# Patient Record
Sex: Female | Born: 1950 | Race: White | Hispanic: No | Marital: Single | Smoking: Never smoker
Health system: Southern US, Community
[De-identification: ages and names within clinical notes are randomized; demographics above are authoritative.]

## PROBLEM LIST (undated history)

## (undated) DIAGNOSIS — R7303 Prediabetes: Secondary | ICD-10-CM

## (undated) DIAGNOSIS — E785 Hyperlipidemia, unspecified: Secondary | ICD-10-CM

## (undated) DIAGNOSIS — K635 Polyp of colon: Secondary | ICD-10-CM

## (undated) DIAGNOSIS — D649 Anemia, unspecified: Secondary | ICD-10-CM

## (undated) DIAGNOSIS — J45909 Unspecified asthma, uncomplicated: Secondary | ICD-10-CM

## (undated) DIAGNOSIS — I219 Acute myocardial infarction, unspecified: Secondary | ICD-10-CM

## (undated) HISTORY — PX: LYMPH NODE BIOPSY: SHX201

## (undated) HISTORY — DX: Unspecified asthma, uncomplicated: J45.909

## (undated) HISTORY — DX: Prediabetes: R73.03

## (undated) HISTORY — DX: Polyp of colon: K63.5

## (undated) HISTORY — DX: Anemia, unspecified: D64.9

## (undated) HISTORY — DX: Hyperlipidemia, unspecified: E78.5

---

## 2013-02-17 DIAGNOSIS — I252 Old myocardial infarction: Secondary | ICD-10-CM | POA: Insufficient documentation

## 2013-05-17 LAB — HM COLONOSCOPY

## 2015-05-27 LAB — HM MAMMOGRAPHY

## 2015-06-19 DIAGNOSIS — E559 Vitamin D deficiency, unspecified: Secondary | ICD-10-CM | POA: Insufficient documentation

## 2015-06-30 DIAGNOSIS — E079 Disorder of thyroid, unspecified: Secondary | ICD-10-CM | POA: Insufficient documentation

## 2015-06-30 DIAGNOSIS — R221 Localized swelling, mass and lump, neck: Secondary | ICD-10-CM | POA: Insufficient documentation

## 2015-07-22 DIAGNOSIS — H269 Unspecified cataract: Secondary | ICD-10-CM | POA: Insufficient documentation

## 2015-07-22 DIAGNOSIS — H43813 Vitreous degeneration, bilateral: Secondary | ICD-10-CM | POA: Insufficient documentation

## 2017-02-28 ENCOUNTER — Encounter (INDEPENDENT_AMBULATORY_CARE_PROVIDER_SITE_OTHER): Payer: Self-pay

## 2017-02-28 ENCOUNTER — Ambulatory Visit (INDEPENDENT_AMBULATORY_CARE_PROVIDER_SITE_OTHER): Payer: BLUE CROSS/BLUE SHIELD | Admitting: Physician Assistant

## 2017-02-28 ENCOUNTER — Encounter: Payer: Self-pay | Admitting: Physician Assistant

## 2017-02-28 VITALS — BP 133/71 | HR 60 | Ht 63.0 in | Wt 166.0 lb

## 2017-02-28 DIAGNOSIS — I252 Old myocardial infarction: Secondary | ICD-10-CM | POA: Diagnosis not present

## 2017-02-28 DIAGNOSIS — Z1211 Encounter for screening for malignant neoplasm of colon: Secondary | ICD-10-CM

## 2017-02-28 DIAGNOSIS — Z130 Encounter for screening for diseases of the blood and blood-forming organs and certain disorders involving the immune mechanism: Secondary | ICD-10-CM | POA: Diagnosis not present

## 2017-02-28 DIAGNOSIS — J4520 Mild intermittent asthma, uncomplicated: Secondary | ICD-10-CM

## 2017-02-28 DIAGNOSIS — Z1231 Encounter for screening mammogram for malignant neoplasm of breast: Secondary | ICD-10-CM

## 2017-02-28 DIAGNOSIS — L84 Corns and callosities: Secondary | ICD-10-CM | POA: Diagnosis not present

## 2017-02-28 DIAGNOSIS — Z1329 Encounter for screening for other suspected endocrine disorder: Secondary | ICD-10-CM | POA: Diagnosis not present

## 2017-02-28 DIAGNOSIS — Z862 Personal history of diseases of the blood and blood-forming organs and certain disorders involving the immune mechanism: Secondary | ICD-10-CM

## 2017-02-28 DIAGNOSIS — Z13820 Encounter for screening for osteoporosis: Secondary | ICD-10-CM

## 2017-02-28 DIAGNOSIS — Z1159 Encounter for screening for other viral diseases: Secondary | ICD-10-CM | POA: Diagnosis not present

## 2017-02-28 DIAGNOSIS — I2510 Atherosclerotic heart disease of native coronary artery without angina pectoris: Secondary | ICD-10-CM

## 2017-02-28 DIAGNOSIS — Z7689 Persons encountering health services in other specified circumstances: Secondary | ICD-10-CM | POA: Diagnosis not present

## 2017-02-28 DIAGNOSIS — Z800 Family history of malignant neoplasm of digestive organs: Secondary | ICD-10-CM

## 2017-02-28 DIAGNOSIS — E559 Vitamin D deficiency, unspecified: Secondary | ICD-10-CM

## 2017-02-28 DIAGNOSIS — R7303 Prediabetes: Secondary | ICD-10-CM | POA: Diagnosis not present

## 2017-02-28 DIAGNOSIS — E663 Overweight: Secondary | ICD-10-CM | POA: Insufficient documentation

## 2017-02-28 DIAGNOSIS — Z780 Asymptomatic menopausal state: Secondary | ICD-10-CM

## 2017-02-28 MED ORDER — ALBUTEROL SULFATE HFA 108 (90 BASE) MCG/ACT IN AERS: 1.0000 | INHALATION_SPRAY | RESPIRATORY_TRACT | 3 refills | Status: DC | PRN

## 2017-02-28 MED ORDER — CALCIUM CARBONATE-VITAMIN D 600-400 MG-UNIT PO TABS: 1.0000 | ORAL_TABLET | Freq: Two times a day (BID) | ORAL | 11 refills | Status: DC

## 2017-02-28 MED ORDER — ATORVASTATIN CALCIUM 40 MG PO TABS: 40.0000 mg | ORAL_TABLET | Freq: Every day | ORAL | 1 refills | Status: DC

## 2017-02-28 NOTE — Addendum Note (Signed)
Addended by: Nelson Chimes E on: 02/28/2017 03:02 PM   Modules accepted: Orders

## 2017-02-28 NOTE — Patient Instructions (Signed)
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
## 2017-03-06 ENCOUNTER — Encounter: Payer: Self-pay | Admitting: Physician Assistant

## 2017-03-08 ENCOUNTER — Ambulatory Visit (INDEPENDENT_AMBULATORY_CARE_PROVIDER_SITE_OTHER): Payer: BLUE CROSS/BLUE SHIELD | Admitting: Physician Assistant

## 2017-03-08 VITALS — BP 134/62 | HR 68 | Temp 98.5°F

## 2017-03-08 DIAGNOSIS — Z23 Encounter for immunization: Secondary | ICD-10-CM | POA: Diagnosis not present

## 2017-03-08 NOTE — Progress Notes (Signed)
Patient came in office today for Prevnar 13 injection. Patient tolerated injection in Left Deltoid well. No immediate complications. Printed vaccine info sheet provided to patient. Patient advised to contact clinic with any concerns.  Vitals:   03/08/17 0948  BP: 134/62  Pulse: 68  Temp: 98.5 F (36.9 C)

## 2017-03-20 ENCOUNTER — Encounter: Payer: Self-pay | Admitting: Podiatry

## 2017-03-20 ENCOUNTER — Ambulatory Visit (INDEPENDENT_AMBULATORY_CARE_PROVIDER_SITE_OTHER): Payer: BLUE CROSS/BLUE SHIELD | Admitting: Podiatry

## 2017-03-20 VITALS — BP 124/73 | HR 81 | Ht 63.0 in | Wt 166.0 lb

## 2017-03-20 DIAGNOSIS — M79671 Pain in right foot: Secondary | ICD-10-CM | POA: Diagnosis not present

## 2017-03-20 DIAGNOSIS — M79672 Pain in left foot: Secondary | ICD-10-CM

## 2017-03-20 DIAGNOSIS — L851 Acquired keratosis [keratoderma] palmaris et plantaris: Secondary | ICD-10-CM | POA: Diagnosis not present

## 2017-03-20 NOTE — Patient Instructions (Signed)
Seen for painful callus on both feet. All lesions debrided and dressed. Return as needed.

## 2017-03-20 NOTE — Progress Notes (Signed)
Heart attack 2013. SUBJECTIVE: 67 y.o. year old female presents complaining of pain in both feet from corns and calluses duration of over 6 months.  Patient is referred by Dr. Maisie Fus.  Patient has moved from [place] last year.  Review of Systems  HENT: Negative.   Respiratory: Negative for shortness of breath and wheezing.        Asthma.  Cardiovascular: Negative for palpitations, claudication and leg swelling.       History of Heart attack in 2013.  Gastrointestinal: Negative.   Genitourinary: Negative.   Musculoskeletal: Negative.   Skin: Negative.      OBJECTIVE: DERMATOLOGIC EXAMINATION: Circular punctate porokeratosis plantar under 5th MPJ area bilateral. No acute skin lesions, no open lesions, no inflamed skin lesions noted.  VASCULAR EXAMINATION OF LOWER LIMBS: Dorsalis Pedis artery: Not palpable on both feet. Posterior Tibial artery:  Palpable with normal pulsation bilateral. Capillary Filling times within 3 seconds in all digits.  No edema or erythema noted. Temperature gradient from tibial crest to dorsum of foot is within normal bilateral.  NEUROLOGIC EXAMINATION OF THE LOWER LIMBS: All epicritic and tactile sensations grossly intact. Sharp and Dull discriminatory sensations at the plantar ball of hallux is intact bilateral.   MUSCULOSKELETAL EXAMINATION: Positive for Prominent 5th MPJ bilateral. Forefoot varus bilateral.   ASSESSMENT: Symptomatic porokeratosis sub 5 bilateral. Forefoot varus bilateral.  PLAN: Reviewed findings and available treatment options, mostly palliative care. All lesions debrided and painful area dressed with Amerigel ointment. Return as needed.

## 2017-03-22 ENCOUNTER — Other Ambulatory Visit: Payer: BLUE CROSS/BLUE SHIELD

## 2017-03-22 ENCOUNTER — Ambulatory Visit: Payer: BLUE CROSS/BLUE SHIELD

## 2017-03-29 ENCOUNTER — Ambulatory Visit (INDEPENDENT_AMBULATORY_CARE_PROVIDER_SITE_OTHER): Payer: BLUE CROSS/BLUE SHIELD

## 2017-03-29 DIAGNOSIS — Z1231 Encounter for screening mammogram for malignant neoplasm of breast: Secondary | ICD-10-CM | POA: Diagnosis not present

## 2017-03-29 DIAGNOSIS — Z13820 Encounter for screening for osteoporosis: Secondary | ICD-10-CM

## 2017-03-29 DIAGNOSIS — E559 Vitamin D deficiency, unspecified: Secondary | ICD-10-CM

## 2017-03-29 DIAGNOSIS — M8588 Other specified disorders of bone density and structure, other site: Secondary | ICD-10-CM | POA: Diagnosis not present

## 2017-03-29 DIAGNOSIS — M85851 Other specified disorders of bone density and structure, right thigh: Secondary | ICD-10-CM

## 2017-03-30 ENCOUNTER — Encounter: Payer: Self-pay | Admitting: Physician Assistant

## 2017-03-30 DIAGNOSIS — M8580 Other specified disorders of bone density and structure, unspecified site: Secondary | ICD-10-CM | POA: Insufficient documentation

## 2017-03-30 NOTE — Progress Notes (Signed)
Bone density shows osteopenia of the hip Recommend twice daily calcium and vitamin D supplement and regular weight bearing exercise to prevent osteoporosis Repeat bone density in 2 years

## 2017-04-18 NOTE — Progress Notes (Signed)
Negative mammogram Recommend repeat screening in 1 year

## 2017-05-04 ENCOUNTER — Encounter: Payer: Self-pay | Admitting: Physician Assistant

## 2017-05-04 ENCOUNTER — Ambulatory Visit (INDEPENDENT_AMBULATORY_CARE_PROVIDER_SITE_OTHER): Payer: BLUE CROSS/BLUE SHIELD | Admitting: Physician Assistant

## 2017-05-04 VITALS — BP 125/72 | HR 74 | Wt 160.3 lb

## 2017-05-04 DIAGNOSIS — I2510 Atherosclerotic heart disease of native coronary artery without angina pectoris: Secondary | ICD-10-CM

## 2017-05-04 DIAGNOSIS — J019 Acute sinusitis, unspecified: Secondary | ICD-10-CM | POA: Diagnosis not present

## 2017-05-04 DIAGNOSIS — J301 Allergic rhinitis due to pollen: Secondary | ICD-10-CM

## 2017-05-04 DIAGNOSIS — I252 Old myocardial infarction: Secondary | ICD-10-CM | POA: Diagnosis not present

## 2017-05-04 LAB — COMPLETE METABOLIC PANEL WITH GFR
AG RATIO: 1.1 (calc) (ref 1.0–2.5)
ALKALINE PHOSPHATASE (APISO): 82 U/L (ref 33–130)
ALT: 9 U/L (ref 6–29)
AST: 13 U/L (ref 10–35)
Albumin: 4 g/dL (ref 3.6–5.1)
BILIRUBIN TOTAL: 0.5 mg/dL (ref 0.2–1.2)
BUN: 12 mg/dL (ref 7–25)
CALCIUM: 9.1 mg/dL (ref 8.6–10.4)
CHLORIDE: 103 mmol/L (ref 98–110)
CO2: 30 mmol/L (ref 20–32)
Creat: 0.82 mg/dL (ref 0.50–0.99)
GFR, Est African American: 87 mL/min/{1.73_m2} (ref >=60)
GFR, Est Non African American: 75 mL/min/{1.73_m2} (ref >=60)
Globulin: 3.6 g/dL (calc) (ref 1.9–3.7)
Glucose, Bld: 92 mg/dL (ref 65–99)
POTASSIUM: 4.1 mmol/L (ref 3.5–5.3)
Sodium: 140 mmol/L (ref 135–146)
Total Protein: 7.6 g/dL (ref 6.1–8.1)

## 2017-05-04 LAB — CBC
HCT: 36.3 % (ref 35.0–45.0)
Hemoglobin: 12 g/dL (ref 11.7–15.5)
MCH: 28.2 pg (ref 27.0–33.0)
MCHC: 33.1 g/dL (ref 32.0–36.0)
MCV: 85.2 fL (ref 80.0–100.0)
MPV: 9.5 fL (ref 7.5–12.5)
PLATELETS: 503 10*3/uL — AB (ref 140–400)
RBC: 4.26 10*6/uL (ref 3.80–5.10)
RDW: 13 % (ref 11.0–15.0)
WBC: 5.9 10*3/uL (ref 3.8–10.8)

## 2017-05-04 LAB — LIPID PANEL W/REFLEX DIRECT LDL
CHOLESTEROL: 167 mg/dL (ref <200)
HDL: 50 mg/dL — AB (ref >50)
LDL Cholesterol (Calc): 100 mg/dL (calc) — ABNORMAL HIGH
Non-HDL Cholesterol (Calc): 117 mg/dL (calc) (ref <130)
Total CHOL/HDL Ratio: 3.3 (calc) (ref <5.0)
Triglycerides: 80 mg/dL (ref <150)

## 2017-05-04 LAB — TSH+FREE T4: TSH W/REFLEX TO FT4: 0.61 mIU/L (ref 0.40–4.50)

## 2017-05-04 MED ORDER — CETIRIZINE HCL 10 MG PO TABS: 10.0000 mg | ORAL_TABLET | Freq: Every day | ORAL | 3 refills | Status: DC

## 2017-05-04 MED ORDER — CEFUROXIME AXETIL 250 MG PO TABS: 250.0000 mg | ORAL_TABLET | Freq: Two times a day (BID) | ORAL | 0 refills | Status: AC

## 2017-05-04 MED ORDER — MONTELUKAST SODIUM 10 MG PO TABS: 10.0000 mg | ORAL_TABLET | Freq: Every day | ORAL | 3 refills | Status: DC

## 2017-05-04 MED ORDER — ATORVASTATIN CALCIUM 40 MG PO TABS: 40.0000 mg | ORAL_TABLET | Freq: Every day | ORAL | 1 refills | Status: DC

## 2017-05-04 MED ORDER — FLUTICASONE PROPIONATE 50 MCG/ACT NA SUSP
1.0000 | Freq: Every day | NASAL | 3 refills | Status: DC
Start: 2017-05-04 — End: 2021-05-11

## 2017-05-04 NOTE — Progress Notes (Signed)
HPI:                                                                Shelly Hammond is a 67 y.o. female who presents to [place] today for sinus congestion  Cough  This is a new problem. The current episode started 1 to 4 weeks ago (x 10 days). The cough is productive of sputum and productive of purulent sputum. Associated symptoms include ear congestion, nasal congestion, postnasal drip and rhinorrhea. Pertinent negatives include no chest pain, fever, headaches, hemoptysis, myalgias, sore throat, shortness of breath or wheezing. The symptoms are aggravated by pollens. Risk factors for lung disease include travel. Treatments tried: Claritin. Her past medical history is significant for environmental allergies.  Requesting for Lipitor prescription to be sent to a different pharmacy  Depression screen [facility] 2/9 02/28/2017  Decreased Interest 0  Down, Depressed, Hopeless 0  PHQ - 2 Score 0    No flowsheet data found.    Past Medical History:  Diagnosis Date  . Anemia   . Asthma   . Benign colon polyp   . Hyperlipidemia   . Prediabetes    Past Surgical History:  Procedure Laterality Date  . LYMPH NODE BIOPSY     neck   Social History   Tobacco Use  . Smoking status: Never Smoker  . Smokeless tobacco: Never Used  Substance Use Topics  . Alcohol use: No    Frequency: Never   family history includes Aneurysm in her brother; Diabetes in her mother.    ROS: negative except as noted in the HPI  Medications: Current Outpatient Medications  Medication Sig Dispense Refill  . albuterol (PROVENTIL HFA;VENTOLIN HFA) 108 (90 Base) MCG/ACT inhaler Inhale 1-2 puffs into the lungs every 4 (four) hours as needed. 1 Inhaler 3  . aspirin EC 81 MG tablet Take 81 mg by mouth daily.    Marland Kitchen atorvastatin (LIPITOR) 40 MG tablet Take 1 tablet (40 mg total) by mouth at bedtime. 90 tablet 1  . Calcium Carbonate-Vitamin D 600-400 MG-UNIT tablet Take 1  tablet by mouth 2 (two) times daily. (Patient not taking: Reported on 05/04/2017) 60 tablet 11  . ferrous sulfate 325 (65 FE) MG tablet Take 325 mg by mouth 2 (two) times daily with a meal.     No current facility-administered medications for this visit.    Allergies  Allergen Reactions  . Penicillins Rash and Swelling  . Dust Mite Extract     Other reaction(s): Unknown sneezing  . Methyldopa Other (See Comments)    vertigo  . Peanut Oil     Other reaction(s): Other (See Comments) Avoid Peanuts, unknown reaction.       Objective:  BP 125/72   Pulse 74   Wt 160 lb 4.8 oz (72.7 kg)   BMI 28.40 kg/m  Gen:  alert, not ill-appearing, no distress, appropriate for age 37: head normocephalic without obvious abnormality, conjunctiva and cornea clear, left TM with serous effusion, right TM obstructed by cerumen, oropharynx clear, moist mucous membranes, nasal mucosa edematous, no sinus tenderness, neck supple, no cervical adenopathy, trachea midline Pulm: Normal work of breathing, normal phonation, clear to auscultation bilaterally, no wheezes, rales or rhonchi CV: Normal rate, regular rhythm, s1  and s2 distinct, no murmurs, clicks or rubs  Neuro: alert and oriented x 3, no tremor MSK: extremities atraumatic, normal gait and station Skin: intact, no rashes on exposed skin, no jaundice, no cyanosis   No results found for this or any previous visit (from the past 72 hour(s)). No results found.    Assessment and Plan: 67 y.o. female with   Allergic rhinitis, Non-recurrent sinusitis - will cover for bacterial sinusitis given age and recent travel - antihistamine, leukotriene antagonist and intranasal corticosteroid for allergic rhinitis  Seasonal allergic rhinitis due to pollen - Plan: montelukast (SINGULAIR) 10 MG tablet, cetirizine (ZYRTEC) 10 MG tablet, fluticasone (FLONASE) 50 MCG/ACT nasal spray  Acute non-recurrent sinusitis, unspecified location - Plan: cefUROXime  (CEFTIN) 250 MG tablet  History of myocardial infarct at age less than 53 years - 09/2011 - Plan: atorvastatin (LIPITOR) 40 MG tablet  Coronary artery disease involving native heart without angina pectoris, unspecified vessel or lesion type - Plan: atorvastatin (LIPITOR) 40 MG tablet   Patient education and anticipatory guidance given Patient agrees with treatment plan Follow-up as needed if symptoms worsen or fail to improve  Darlyne Russian PA-C

## 2017-05-04 NOTE — Patient Instructions (Signed)

## 2017-05-05 LAB — IRON,TIBC AND FERRITIN PANEL
%SAT: 23 % (ref 11–50)
FERRITIN: 22 ng/mL (ref 20–288)
Iron: 91 ug/dL (ref 45–160)
TIBC: 401 mcg/dL (calc) (ref 250–450)

## 2017-05-05 LAB — HEPATITIS C ANTIBODY
Hepatitis C Ab: NONREACTIVE
SIGNAL TO CUT-OFF: 0.03 (ref <1.00)

## 2017-05-05 LAB — HEMOGLOBIN A1C
HEMOGLOBIN A1C: 6.1 %{Hb} — AB (ref <5.7)
Mean Plasma Glucose: 128 (calc)
eAG (mmol/L): 7.1 (calc)

## 2017-05-06 NOTE — Progress Notes (Signed)
A1c is consistent with prediabetes. Recommend following a low-carb ADA (diabetic) diet - 30g carb per meal and 15g per snack Platelets are a little high. This can be due to inflammation, likely her sinus infection. White blood cell counts are normal, which is reassuring. No evidence of thyroid disease Cholesterol is at goal. Continue Atorvastatin

## 2017-05-30 ENCOUNTER — Ambulatory Visit: Payer: BLUE CROSS/BLUE SHIELD | Admitting: Physician Assistant

## 2017-06-01 ENCOUNTER — Ambulatory Visit (INDEPENDENT_AMBULATORY_CARE_PROVIDER_SITE_OTHER): Payer: BLUE CROSS/BLUE SHIELD | Admitting: Physician Assistant

## 2017-06-01 ENCOUNTER — Encounter: Payer: Self-pay | Admitting: Physician Assistant

## 2017-06-01 VITALS — BP 126/70 | HR 61 | Ht 62.99 in | Wt 158.0 lb

## 2017-06-01 DIAGNOSIS — Z79899 Other long term (current) drug therapy: Secondary | ICD-10-CM

## 2017-06-01 DIAGNOSIS — Z1211 Encounter for screening for malignant neoplasm of colon: Secondary | ICD-10-CM | POA: Diagnosis not present

## 2017-06-01 DIAGNOSIS — Z800 Family history of malignant neoplasm of digestive organs: Secondary | ICD-10-CM

## 2017-06-01 DIAGNOSIS — R7303 Prediabetes: Secondary | ICD-10-CM

## 2017-06-01 NOTE — Progress Notes (Signed)
HPI:                                                                Shelly Hammond is a 67 y.o. female who presents to [place]: [place] today for medication mgmt  Prediabetes: A1C 1 month ago 6.1. Has cut back on soda and switched to seltzer.  HLD: taking Atorvastatin daily. Compliant with medications.   Seasonal allergies: Singulair has made a huge difference. Well controlled.  Depression screen [facility] 2/9 02/28/2017  Decreased Interest 0  Down, Depressed, Hopeless 0  PHQ - 2 Score 0    No flowsheet data found.    Past Medical History:  Diagnosis Date  . Anemia   . Asthma   . Benign colon polyp   . Hyperlipidemia   . Prediabetes    Past Surgical History:  Procedure Laterality Date  . LYMPH NODE BIOPSY     neck   Social History   Tobacco Use  . Smoking status: Never Smoker  . Smokeless tobacco: Never Used  Substance Use Topics  . Alcohol use: No    Frequency: Never   family history includes Aneurysm in her brother; Diabetes in her mother.    ROS: negative except as noted in the HPI  Medications: Current Outpatient Medications  Medication Sig Dispense Refill  . aspirin EC 81 MG tablet Take 81 mg by mouth daily.    Marland Kitchen atorvastatin (LIPITOR) 40 MG tablet Take 1 tablet (40 mg total) by mouth at bedtime. 90 tablet 1  . Calcium Carbonate-Vitamin D 600-400 MG-UNIT tablet Take 1 tablet by mouth 2 (two) times daily. 60 tablet 11  . cetirizine (ZYRTEC) 10 MG tablet Take 1 tablet (10 mg total) by mouth at bedtime. 90 tablet 3  . fluticasone (FLONASE) 50 MCG/ACT nasal spray Place 1 spray into both nostrils daily. 16 g 3  . montelukast (SINGULAIR) 10 MG tablet Take 1 tablet (10 mg total) by mouth at bedtime. 90 tablet 3   No current facility-administered medications for this visit.    Allergies  Allergen Reactions  . Penicillins Rash and Swelling  . Dust Mite Extract     Other reaction(s): Unknown sneezing  . Methyldopa Other  (See Comments)    vertigo  . Peanut Oil     Other reaction(s): Other (See Comments) Avoid Peanuts, unknown reaction.       Objective:  BP 126/70   Pulse 61   Ht 5' 2.99" (1.6 m)   Wt 158 lb (71.7 kg)   SpO2 99%   BMI 28.00 kg/m  Gen:  alert, not ill-appearing, no distress, appropriate for age 78: head normocephalic without obvious abnormality, conjunctiva and cornea clear, trachea midline Pulm: Normal work of breathing, normal phonation, clear to auscultation bilaterally, no wheezes, rales or rhonchi CV: Normal rate, regular rhythm, s1 and s2 distinct, no murmurs, clicks or rubs  Neuro: alert and oriented x 3, no tremor MSK: extremities atraumatic, normal gait and station Skin: intact, no rashes on exposed skin, no jaundice, no cyanosis Psych: well-groomed, cooperative, good eye contact, euthymic mood, affect mood-congruent, speech is articulate, and thought processes clear and goal-directed    No results found for this or any previous visit (from the past 72 hour(s)). No results found.  Assessment and Plan: 67 y.o. female with   Encounter for medication management  Prediabetes  Family history of colon cancer in father - Plan: Ambulatory referral to Gastroenterology  Screening for colon cancer - Plan: Ambulatory referral to Gastroenterology  - counseled on prediabetes eating plan. Recheck A1c in 6 months  - per [facility], due for colonoscopy Q5Y. However, she had a first degree relative with colon cancer and is concerned about not being screened as frequently. Referring to GI    Patient education and anticipatory guidance given Patient agrees with treatment plan Follow-up in 6 months for medication management or sooner as needed if symptoms worsen or fail to improve  Darlyne Russian PA-C

## 2017-06-01 NOTE — Patient Instructions (Signed)
Prediabetes Eating Plan Prediabetes-also called impaired glucose tolerance or impaired fasting glucose-is a condition that causes blood sugar (blood glucose) levels to be higher than normal. Following a healthy diet can help to keep prediabetes under control. It can also help to lower the risk of type 2 diabetes and heart disease, which are increased in people who have prediabetes. Along with regular exercise, a healthy diet:  Promotes weight loss.  Helps to control blood sugar levels.  Helps to improve the way that the body uses insulin.  What do I need to know about this eating plan?  Use the glycemic index (GI) to plan your meals. The index tells you how quickly a food will raise your blood sugar. Choose low-GI foods. These foods take a longer time to raise blood sugar.  Pay close attention to the amount of carbohydrates in the food that you eat. Carbohydrates increase blood sugar levels.  Keep track of how many calories you take in. Eating the right amount of calories will help you to achieve a healthy weight. Losing about 7 percent of your starting weight can help to prevent type 2 diabetes.  You may want to follow a Mediterranean diet. This diet includes a lot of vegetables, lean meats or fish, whole grains, fruits, and healthy oils and fats. What foods can I eat? Grains Whole grains, such as whole-wheat or whole-grain breads, crackers, cereals, and pasta. Unsweetened oatmeal. Bulgur. Barley. Quinoa. Brown rice. Corn or whole-wheat flour tortillas or taco shells. Vegetables Lettuce. Spinach. Peas. Beets. Cauliflower. Cabbage. Broccoli. Carrots. Tomatoes. Squash. Eggplant. Herbs. Peppers. Onions. Cucumbers. Brussels sprouts. Fruits Berries. Bananas. Apples. Oranges. Grapes. Papaya. Mango. Pomegranate. Kiwi. Grapefruit. Cherries. Meats and Other Protein Sources Seafood. Lean meats, such as chicken and turkey or lean cuts of pork and beef. Tofu. Eggs. Nuts. Beans. Dairy Low-fat or  fat-free dairy products, such as yogurt, cottage cheese, and cheese. Beverages Water. Tea. Coffee. Sugar-free or diet soda. Seltzer water. Milk. Milk alternatives, such as soy or almond milk. Condiments Mustard. Relish. Low-fat, low-sugar ketchup. Low-fat, low-sugar barbecue sauce. Low-fat or fat-free mayonnaise. Sweets and Desserts Sugar-free or low-fat pudding. Sugar-free or low-fat ice cream and other frozen treats. Fats and Oils Avocado. Walnuts. Olive oil. The items listed above may not be a complete list of recommended foods or beverages. Contact your dietitian for more options. What foods are not recommended? Grains Refined white flour and flour products, such as bread, pasta, snack foods, and cereals. Beverages Sweetened drinks, such as sweet iced tea and soda. Sweets and Desserts Baked goods, such as cake, cupcakes, pastries, cookies, and cheesecake. The items listed above may not be a complete list of foods and beverages to avoid. Contact your dietitian for more information. This information is not intended to replace advice given to you by your health care provider. Make sure you discuss any questions you have with your health care provider. Document Released: 05/13/2014 Document Revised: 06/04/2015 Document Reviewed: 01/22/2014 Elsevier Interactive Patient Education  2017 [employer]  

## 2017-11-27 ENCOUNTER — Ambulatory Visit: Payer: BLUE CROSS/BLUE SHIELD | Admitting: Physician Assistant

## 2021-04-20 ENCOUNTER — Other Ambulatory Visit: Payer: Self-pay | Admitting: Family Medicine

## 2021-04-20 DIAGNOSIS — Z1231 Encounter for screening mammogram for malignant neoplasm of breast: Secondary | ICD-10-CM

## 2021-04-22 ENCOUNTER — Other Ambulatory Visit: Payer: Self-pay | Admitting: Family Medicine

## 2021-04-22 DIAGNOSIS — Z1231 Encounter for screening mammogram for malignant neoplasm of breast: Secondary | ICD-10-CM

## 2021-04-23 ENCOUNTER — Encounter: Payer: Self-pay | Admitting: Gastroenterology

## 2021-04-27 ENCOUNTER — Telehealth: Payer: Self-pay | Admitting: Hematology and Oncology

## 2021-04-27 NOTE — Telephone Encounter (Signed)
Scheduled appt per 4/17 referral. Spoke to representative at senior care, [name], who is aware of appt date and time. Shelly Hammond  ?

## 2021-05-03 ENCOUNTER — Ambulatory Visit
Admission: RE | Admit: 2021-05-03 | Discharge: 2021-05-03 | Disposition: A | Discharge status: Home / Self Care | Payer: Medicare Other | Source: Ambulatory Visit | Attending: Internal Medicine | Admitting: Internal Medicine

## 2021-05-03 DIAGNOSIS — Z1231 Encounter for screening mammogram for malignant neoplasm of breast: Secondary | ICD-10-CM

## 2021-05-05 ENCOUNTER — Other Ambulatory Visit: Payer: Self-pay | Admitting: Internal Medicine

## 2021-05-05 DIAGNOSIS — R928 Other abnormal and inconclusive findings on diagnostic imaging of breast: Secondary | ICD-10-CM

## 2021-05-11 ENCOUNTER — Ambulatory Visit (INDEPENDENT_AMBULATORY_CARE_PROVIDER_SITE_OTHER): Payer: Medicare Other | Admitting: Gastroenterology

## 2021-05-11 ENCOUNTER — Encounter: Payer: Self-pay | Admitting: Gastroenterology

## 2021-05-11 VITALS — BP 124/66 | HR 74 | Ht 62.0 in | Wt 151.0 lb

## 2021-05-11 DIAGNOSIS — Z800 Family history of malignant neoplasm of digestive organs: Secondary | ICD-10-CM | POA: Diagnosis not present

## 2021-05-11 DIAGNOSIS — D509 Iron deficiency anemia, unspecified: Secondary | ICD-10-CM | POA: Diagnosis not present

## 2021-05-11 MED ORDER — SUTAB 1479-225-188 MG PO TABS: 1.0000 | ORAL_TABLET | Freq: Once | ORAL | 0 refills | Status: DC

## 2021-05-11 NOTE — Progress Notes (Signed)
? ?HPI : Shelly Hammond is a very pleasant 71 year old female with a history of coronary artery disease who is referred to [place] by Rob Bunting, PA-C for colon cancer screening.  Patient's father was diagnosed with colon cancer in his 78s.  The patient's last colonoscopy was in [place] and 2015 and reportedly had a polyp removed.  Patient also has a longstanding history of iron deficiency anemia.  She recalls being told she was anemic as a child.  She tried taking oral iron supplements, but she did not tolerate it (GI upset).  She recently had labs done by her PCM, but I do not have visibility of these labs.  The only labs I can see are from April 2019, when her hemoglobin was 12, MCV 85 platelets 500.  An iron panel at that time was essentially normal (ferritin 22, saturation 23%, TIBC 400). ?She reports normal bowel habits, with formed soft stool most every day.  She denies seeing any blood in her stool and denies any melenic appearing stool.  She has occasional crampy pain in her upper abdomen, underneath her left rib, but otherwise denies any problems with abdominal pain.  She denies any GERD symptoms.  No dysphagia.  No nausea or vomiting.  Her weight has been stable. ?She reports having a myocardial infarction in 2013, which she says was stress-induced.  She did not require a stent.  She reports having an echocardiogram done about a week ago, but she does not know the results. ?She denies symptoms of chest pain/chest pressure, shortness of breath, lightheadedness/dizziness, orthopnea, lower extremity edema. ? ? ?Past Medical History:  ?Diagnosis Date  ? Anemia   ? Asthma   ? Benign colon polyp   ? Hyperlipidemia   ? Prediabetes   ? ? ? ?Past Surgical History:  ?Procedure Laterality Date  ? LYMPH NODE BIOPSY    ? neck  ? ?Family History  ?Problem Relation Age of Onset  ? Diabetes Mother   ? Aneurysm Brother   ? ?Social History  ? ?Tobacco Use  ? Smoking status: Never  ? Smokeless tobacco: Never  ?Substance Use  Topics  ? Alcohol use: No  ? Drug use: No  ? ?Current Outpatient Medications  ?Medication Sig Dispense Refill  ? aspirin EC 81 MG tablet Take 81 mg by mouth daily.    ? atorvastatin (LIPITOR) 40 MG tablet Take 1 tablet (40 mg total) by mouth at bedtime. 90 tablet 1  ? Calcium Carbonate-Vitamin D 600-400 MG-UNIT tablet Take 1 tablet by mouth 2 (two) times daily. 60 tablet 11  ? cetirizine (ZYRTEC) 10 MG tablet Take 1 tablet (10 mg total) by mouth at bedtime. 90 tablet 3  ? fluticasone (FLONASE) 50 MCG/ACT nasal spray Place 1 spray into both nostrils daily. 16 g 3  ? montelukast (SINGULAIR) 10 MG tablet Take 1 tablet (10 mg total) by mouth at bedtime. 90 tablet 3  ? ?No current facility-administered medications for this visit.  ? ?Allergies  ?Allergen Reactions  ? Penicillins Rash and Swelling  ? Dust Mite Extract   ?  Other reaction(s): Unknown ?sneezing  ? Methyldopa Other (See Comments)  ?  vertigo  ? Peanut Oil   ?  Other reaction(s): Other (See Comments) ?Avoid Peanuts, unknown reaction.  ? ? ? ?Review of Systems: ?All systems reviewed and negative except where noted in HPI.  ? ? ?MM 3D SCREEN BREAST BILATERAL ? ?Addendum Date: 05/11/2021   ?ADDENDUM REPORT: 05/11/2021 07:38 ADDENDUM: Prior mammograms dated 03/29/2017, 06/30/2015,  05/26/2015 and 04/30/2013 have become available for comparison. When compared to these prior exams, the mammographic appearance of bilateral breasts is stable in appearance. There is no mammographic evidence of malignancy. Recommendation: Screening mammogram in one year.(Code:SM-B-01Y) BI-RADS category: 1: Negative. Electronically Signed   By: Everlean Alstrom M.D.   On: 05/11/2021 07:38  ? ?Result Date: 05/11/2021 ?CLINICAL DATA:  Screening. EXAM: DIGITAL SCREENING BILATERAL MAMMOGRAM WITH TOMOSYNTHESIS AND CAD TECHNIQUE: Bilateral screening digital craniocaudal and mediolateral oblique mammograms were obtained. Bilateral screening digital breast tomosynthesis was performed. The images  were evaluated with computer-aided detection. COMPARISON:  Previous exam(s). ACR Breast Density Category c: The breast tissue is heterogeneously dense, which may obscure small masses. FINDINGS: In the left breast, a possible asymmetry warrants further evaluation. In the right breast, no findings suspicious for malignancy. IMPRESSION: Further evaluation is suggested for possible asymmetry in the left breast. RECOMMENDATION: Diagnostic mammogram and possibly ultrasound of the left breast. (Code:[id number]) The patient will be contacted regarding the findings, and additional imaging will be scheduled. BI-RADS CATEGORY  0: Incomplete. Need additional imaging evaluation and/or prior mammograms for comparison. Electronically Signed: By: Everlean Alstrom M.D. On: 05/04/2021 10:39  ? ?Physical Exam: ?BP 124/66   Pulse 74   Ht '5\' 2"'$  (1.575 m)   Wt 151 lb (68.5 kg)   SpO2 98%   BMI 27.62 kg/m?  ?Constitutional: Pleasant,well-developed, Caucasian female in no acute distress. ?HEENT: Normocephalic and atraumatic. Conjunctivae are normal. No scleral icterus. ?Neck supple.  ?Cardiovascular: Normal rate, regular rhythm.  ?Pulmonary/chest: Effort normal and breath sounds normal. No wheezing, rales or rhonchi. ?Abdominal: Soft, nondistended, nontender. Bowel sounds active throughout. There are no masses palpable. No hepatomegaly. ?Extremities: no edema ?Neurological: Alert and oriented to person place and time. ?Skin: Skin is warm and dry. No rashes noted. ?Psychiatric: Normal mood and affect. Behavior is normal. ? ?CBC ?   ?Component Value Date/Time  ? WBC 5.9 05/04/2017 0957  ? RBC 4.26 05/04/2017 0957  ? HGB 12.0 05/04/2017 0957  ? HCT 36.3 05/04/2017 0957  ? PLT 503 (H) 05/04/2017 0957  ? MCV 85.2 05/04/2017 0957  ? MCH 28.2 05/04/2017 0957  ? MCHC 33.1 05/04/2017 0957  ? RDW 13.0 05/04/2017 0957  ? ? ?CMP  ?   ?Component Value Date/Time  ? NA 140 05/04/2017 0957  ? K 4.1 05/04/2017 0957  ? CL 103 05/04/2017 0957  ? CO2 30  05/04/2017 0957  ? GLUCOSE 92 05/04/2017 0957  ? BUN 12 05/04/2017 0957  ? CREATININE 0.82 05/04/2017 0957  ? CALCIUM 9.1 05/04/2017 0957  ? PROT 7.6 05/04/2017 0957  ? AST 13 05/04/2017 0957  ? ALT 9 05/04/2017 0957  ? BILITOT 0.5 05/04/2017 0957  ? GFRNONAA 75 05/04/2017 0957  ? GFRAA 87 05/04/2017 0957  ? ? ? ?ASSESSMENT AND PLAN: ?71 year old female with history of asthma, hyperlipidemia and reported chronic iron deficiency anemia, with family history of colon cancer overdue for colonoscopy.  She should have a colonoscopy every 5 years given her family history (father, 23s).  Her last colonoscopy was in 2015 and polyp was reportedly removed by the patient.  She reports a longstanding history of iron deficiency anemia, and reports recently having labs done which confirmed this.  We will request records from Dr. Siri Cole office to verify the iron deficiency anemia.  Assuming that this is true, I recommend she have an EGD to exclude upper GI causes of iron deficiency anemia.  She also had an echocardiogram done recently; we will  request those results as well. ? ?Family history of colon cancer/personal history of polyps ?- Colonoscopy (should be performed every 5 years based on family history alone) ? ?Iron deficiency anemia ?- Obtain lab results from Dr. Siri Cole office ?-EGD assuming labs confirm iron deficiency anemia ? ?The details, risks (including bleeding, perforation, infection, missed lesions, medication reactions and possible hospitalization or surgery if complications occur), benefits, and alternatives to EGD/colonoscopy with possible biopsy and possible polypectomy were discussed with the patient and she consents to proceed.  ? ? ?Kinslie Hove E. Candis Schatz, MD ?[facility] ? ? ?CC: Ottis Stain* ? ?

## 2021-05-11 NOTE — Patient Instructions (Addendum)
If you are age 69 or older, your body mass index should be between 23-30. Your Body mass index is 27.62 kg/m?Marland Kitchen If this is out of the aforementioned range listed, please consider follow up with your Primary Care Provider. ? ?If you are age 32 or younger, your body mass index should be between 19-25. Your Body mass index is 27.62 kg/m?Marland Kitchen If this is out of the aformentioned range listed, please consider follow up with your Primary Care Provider.  ? ?You have been scheduled for an endoscopy and colonoscopy. Please follow the written instructions given to you at your visit today. ?Please pick up your prep supplies at the pharmacy within the next 1-3 days. ?If you use inhalers (even only as needed), please bring them with you on the day of your procedure.  ? ?________________________________________________________ ? ?The [facility] providers would like to encourage you to use [facility] to communicate with providers for non-urgent requests or questions.  Due to long hold times on the telephone, sending your provider a message by [facility] may be a faster and more efficient way to get a response.  Please allow 48 business hours for a response.  Please remember that this is for non-urgent requests.  ?_______________________________________________________ ? ?It was a pleasure to see you today! ? ?Thank you for trusting me with your gastrointestinal care!   ? ?Scott E.Candis Schatz, MD  ? ?

## 2021-05-12 ENCOUNTER — Telehealth: Payer: Self-pay

## 2021-05-12 ENCOUNTER — Telehealth: Payer: Self-pay | Admitting: Gastroenterology

## 2021-05-12 ENCOUNTER — Other Ambulatory Visit: Payer: Self-pay

## 2021-05-12 ENCOUNTER — Telehealth: Payer: Self-pay | Admitting: *Deleted

## 2021-05-12 MED ORDER — SUTAB 1479-225-188 MG PO TABS: 1.0000 | ORAL_TABLET | Freq: Once | ORAL | 0 refills | Status: AC

## 2021-05-12 NOTE — Telephone Encounter (Signed)
TCT patient regarding her new patient appt. On 05/13/21. Spoke with her and she is in the process of arranging transportation for tomorrow. Advised to arrive 15 minutes prior to appt time in order to go through registration process. Pt voiced understanding and appreciative of call. ?

## 2021-05-12 NOTE — Telephone Encounter (Signed)
Contacted pt to go over mm results pt is aware and doesn't have any questions or concerns  

## 2021-05-12 NOTE — Telephone Encounter (Signed)
Received call from patient, states the sutab was sent to the wrong pharmacy. Patient is requesting sutab be sent to [facility]: [address] [place], [place] [address]. Please advise ?

## 2021-05-12 NOTE — Telephone Encounter (Signed)
Shelly Hammond has been sent to [facility] pharmacy: [address] , [place] [address]. ?

## 2021-05-13 ENCOUNTER — Inpatient Hospital Stay: Payer: Medicare Other

## 2021-05-13 ENCOUNTER — Other Ambulatory Visit: Payer: Self-pay

## 2021-05-13 ENCOUNTER — Inpatient Hospital Stay: Payer: Medicare Other | Attending: Hematology and Oncology | Admitting: Hematology and Oncology

## 2021-05-13 VITALS — BP 134/55 | HR 79 | Temp 98.8°F | Resp 16 | Wt 150.6 lb

## 2021-05-13 DIAGNOSIS — D500 Iron deficiency anemia secondary to blood loss (chronic): Secondary | ICD-10-CM

## 2021-05-13 DIAGNOSIS — D509 Iron deficiency anemia, unspecified: Secondary | ICD-10-CM | POA: Insufficient documentation

## 2021-05-13 DIAGNOSIS — Z800 Family history of malignant neoplasm of digestive organs: Secondary | ICD-10-CM | POA: Insufficient documentation

## 2021-05-13 LAB — CMP (CANCER CENTER ONLY)
ALT: 19 U/L (ref 0–44)
AST: 21 U/L (ref 15–41)
Albumin: 4.2 g/dL (ref 3.5–5.0)
Alkaline Phosphatase: 84 U/L (ref 38–126)
Anion gap: 6 (ref 5–15)
BUN: 20 mg/dL (ref 8–23)
CO2: 29 mmol/L (ref 22–32)
Calcium: 8.9 mg/dL (ref 8.9–10.3)
Chloride: 106 mmol/L (ref 98–111)
Creatinine: 0.98 mg/dL (ref 0.44–1.00)
GFR, Estimated: >60 mL/min (ref >60)
Glucose, Bld: 131 mg/dL — ABNORMAL HIGH (ref 70–99)
Potassium: 3.5 mmol/L (ref 3.5–5.1)
Sodium: 141 mmol/L (ref 135–145)
Total Bilirubin: 0.3 mg/dL (ref 0.3–1.2)
Total Protein: 7.8 g/dL (ref 6.5–8.1)

## 2021-05-13 LAB — IRON AND IRON BINDING CAPACITY (CC-WL,HP ONLY)
Iron: 23 ug/dL — ABNORMAL LOW (ref 28–170)
Saturation Ratios: 4 % — ABNORMAL LOW (ref 10.4–31.8)
TIBC: 588 ug/dL — ABNORMAL HIGH (ref 250–450)
UIBC: 565 ug/dL — ABNORMAL HIGH (ref 148–442)

## 2021-05-13 LAB — CBC WITH DIFFERENTIAL (CANCER CENTER ONLY)
Abs Immature Granulocytes: 0.01 10*3/uL (ref 0.00–0.07)
Basophils Absolute: 0.1 10*3/uL (ref 0.0–0.1)
Basophils Relative: 1 %
Eosinophils Absolute: 0.2 10*3/uL (ref 0.0–0.5)
Eosinophils Relative: 3 %
HCT: 29.5 % — ABNORMAL LOW (ref 36.0–46.0)
Hemoglobin: 8.3 g/dL — ABNORMAL LOW (ref 12.0–15.0)
Immature Granulocytes: 0 %
Lymphocytes Relative: 17 %
Lymphs Abs: 0.9 10*3/uL (ref 0.7–4.0)
MCH: 20.5 pg — ABNORMAL LOW (ref 26.0–34.0)
MCHC: 28.1 g/dL — ABNORMAL LOW (ref 30.0–36.0)
MCV: 73 fL — ABNORMAL LOW (ref 80.0–100.0)
Monocytes Absolute: 0.4 10*3/uL (ref 0.1–1.0)
Monocytes Relative: 8 %
Neutro Abs: 3.9 10*3/uL (ref 1.7–7.7)
Neutrophils Relative %: 71 %
Platelet Count: 521 10*3/uL — ABNORMAL HIGH (ref 150–400)
RBC: 4.04 MIL/uL (ref 3.87–5.11)
RDW: 16.7 % — ABNORMAL HIGH (ref 11.5–15.5)
WBC Count: 5.4 10*3/uL (ref 4.0–10.5)
nRBC: 0 % (ref 0.0–0.2)

## 2021-05-13 LAB — RETIC PANEL
Immature Retic Fract: 16.5 % — ABNORMAL HIGH (ref 2.3–15.9)
RBC.: 3.97 MIL/uL (ref 3.87–5.11)
Retic Count, Absolute: 37.7 10*3/uL (ref 19.0–186.0)
Retic Ct Pct: 1 % (ref 0.4–3.1)
Reticulocyte Hemoglobin: 20.2 pg — ABNORMAL LOW (ref >27.9)

## 2021-05-13 NOTE — Progress Notes (Signed)
?[facility] ?[phone]   [phone] ? ?INITIAL CONSULT NOTE ? ?Patient Care Team: ?Antony Blackbird, MD as PCP - General (Family Medicine) ? ?Hematological/Oncological History ?# Iron Deficiency Anemia of Unclear Etiology ?05/03/2021: white blood cell count 3.3, hemoglobin 8.7, MCV 73, and platelets of 154.  Additionally labs showed iron binding capacity 525, and iron saturation of 4%. ?05/13/2021: Establish care with Dr. Lorenso Courier ? ?CHIEF COMPLAINTS/PURPOSE OF CONSULTATION:  ?"Iron Deficiency Anemia " ? ?HISTORY OF PRESENTING ILLNESS:  ?Shelly Hammond 71 y.o. female with medical history significant for HLD, PreDM type II, asthma who presents for evaluation of iron deficiency.  ? ?On review of the previous records Shelly Hammond had labs collected on 05/03/2021 which showed white blood cell count 3.3, hemoglobin 8.7, MCV 73, and platelets of 154.  Additionally labs showed iron binding capacity 525, and iron saturation of 4%.  Due to concern for iron deficiency anemia the patient was referred to hematology for further evaluation and management. ? ?On exam today Shelly Hammond reports that she has had issues with anemia since she was a child.  She reports that she has taken iron pills before but that it causes constipation.  She notes that she does her best to eat an iron rich diet but it is "not as good as I should".  She notes that she has vertigo which comes and goes.  She is currently scheduled for an endoscopy and colonoscopy on 06/25/2021 with Dr. Candis Schatz.  She reports that her last colonoscopy she was told that she had a small polyp.  She notes that she is having some issues with shortness of breath and feeling fatigued.  She notes that the staircase in her home she can walk from bottom to top though she does get somewhat winded.  She currently denies any overt signs of bleeding, bruising, or dark stools. ? ?On further discussion she reports that her family history is remarkable for her father  who died of colon cancer.  She reports that she has a cousin who eats too much junk food.  She reports that she is a never smoker never drinker and currently takes care of of 13 cats in her house.  She otherwise denies any fevers, chills, sweats, nausea, ming or diarrhea.  Full 10 point ROS is listed below. ? ?MEDICAL HISTORY:  ?Past Medical History:  ?Diagnosis Date  ? Anemia   ? Asthma   ? Benign colon polyp   ? Hyperlipidemia   ? Prediabetes   ? ? ?SURGICAL HISTORY: ?Past Surgical History:  ?Procedure Laterality Date  ? LYMPH NODE BIOPSY    ? neck  ? ? ?SOCIAL HISTORY: ?Social History  ? ?Socioeconomic History  ? Marital status: Single  ?  Spouse name: Not on file  ? Number of children: Not on file  ? Years of education: Not on file  ? Highest education level: Not on file  ?Occupational History  ? Not on file  ?Tobacco Use  ? Smoking status: Never  ? Smokeless tobacco: Never  ?Vaping Use  ? Vaping Use: Never used  ?Substance and Sexual Activity  ? Alcohol use: No  ? Drug use: No  ? Sexual activity: Not Currently  ?  Birth control/protection: None  ?Other Topics Concern  ? Not on file  ?Social History Narrative  ? Not on file  ? ?Social Determinants of Health  ? ?Financial Resource Strain: Not on file  ?Food Insecurity: Not on file  ?Transportation Needs: Not on file  ?  Physical Activity: Not on file  ?Stress: Not on file  ?Social Connections: Not on file  ?Intimate Partner Violence: Not on file  ? ? ?FAMILY HISTORY: ?Family History  ?Problem Relation Age of Onset  ? Diabetes Mother   ? Colon cancer Father   ? Aneurysm Brother   ? ? ?ALLERGIES:  is allergic to penicillins, dust mite extract, methyldopa, and peanut oil. ? ?MEDICATIONS:  ?Current Outpatient Medications  ?Medication Sig Dispense Refill  ? aspirin EC 81 MG tablet Take 81 mg by mouth daily.    ? pantoprazole (PROTONIX) 40 MG tablet Take 40 mg by mouth daily.    ? SUTAB [id number] MG TABS Take by mouth.    ? ?No current facility-administered  medications for this visit.  ? ? ?REVIEW OF SYSTEMS:   ?Constitutional: ( - ) fevers, ( - )  chills , ( - ) night sweats ?Eyes: ( - ) blurriness of vision, ( - ) double vision, ( - ) watery eyes ?Ears, nose, mouth, throat, and face: ( - ) mucositis, ( - ) sore throat ?Respiratory: ( - ) cough, ( - ) dyspnea, ( - ) wheezes ?Cardiovascular: ( - ) palpitation, ( - ) chest discomfort, ( - ) lower extremity swelling ?Gastrointestinal:  ( - ) nausea, ( - ) heartburn, ( - ) change in bowel habits ?Skin: ( - ) abnormal skin rashes ?Lymphatics: ( - ) new lymphadenopathy, ( - ) easy bruising ?Neurological: ( - ) numbness, ( - ) tingling, ( - ) new weaknesses ?Behavioral/Psych: ( - ) mood change, ( - ) new changes  ?All other systems were reviewed with the patient and are negative. ? ?PHYSICAL EXAMINATION: ? ?Vitals:  ? 05/13/21 1348  ?BP: (!) 134/55  ?Pulse: 79  ?Resp: 16  ?Temp: 98.8 ?F (37.1 ?C)  ?SpO2: 99%  ? ?Filed Weights  ? 05/13/21 1348  ?Weight: 150 lb 9.6 oz (68.3 kg)  ? ? ?GENERAL: well appearing elderly Caucasian female in NAD  ?SKIN: skin color, texture, turgor are normal, no rashes or significant lesions ?EYES: conjunctiva are pink and non-injected, sclera clear ?LUNGS: clear to auscultation and percussion with normal breathing effort ?HEART: regular rate & rhythm and no murmurs and no lower extremity edema ?Musculoskeletal: no cyanosis of digits and no clubbing  ?PSYCH: alert & oriented x 3, fluent speech ?NEURO: no focal motor/sensory deficits ? ?LABORATORY DATA:  ?I have reviewed the data as listed ? ?  Latest Ref Rng & Units 05/13/2021  ?  2:42 PM 05/04/2017  ?  9:57 AM  ?CBC  ?WBC 4.0 - 10.5 K/uL 5.4   5.9    ?Hemoglobin 12.0 - 15.0 g/dL 8.3   12.0    ?Hematocrit 36.0 - 46.0 % 29.5   36.3    ?Platelets 150 - 400 K/uL 521   503    ? ? ? ?  Latest Ref Rng & Units 05/13/2021  ?  2:42 PM 05/04/2017  ?  9:57 AM  ?CMP  ?Glucose 70 - 99 mg/dL 131   92    ?BUN 8 - 23 mg/dL 20   12    ?Creatinine 0.44 - 1.00 mg/dL 0.98    0.82    ?Sodium 135 - 145 mmol/L 141   140    ?Potassium 3.5 - 5.1 mmol/L 3.5   4.1    ?Chloride 98 - 111 mmol/L 106   103    ?CO2 22 - 32 mmol/L 29   30    ?  Calcium 8.9 - 10.3 mg/dL 8.9   9.1    ?Total Protein 6.5 - 8.1 g/dL 7.8   7.6    ?Total Bilirubin 0.3 - 1.2 mg/dL 0.3   0.5    ?Alkaline Phos 38 - 126 U/L 84     ?AST 15 - 41 U/L 21   13    ?ALT 0 - 44 U/L 19   9    ? ? ? ?ASSESSMENT & PLAN ?Shelly Hammond 71 y.o. female with medical history significant for HLD, PreDM type II, asthma who presents for evaluation of iron deficiency.  ? ?After review of the labs, review of the records, and discussion with the patient the patients findings are most consistent with iron deficiency anemia of unclear etiology.  Fortunately the patient is already established with GI and is scheduled for colonoscopy and endoscopy in June 2023.  Additionally there are no overt signs of bleeding at this time. ? ?# Iron Deficiency Anemia 2/2 to unclear etiology ?-- Findings are consistent with iron deficiency anemia secondary to an unclear etiology, concern for GI bleed ?-- Patient has endoscopy and colonoscopy scheduled in June 2023 ?--We will confirm iron deficiency anemia by ordering iron panel and ferritin as well as reticulocytes, CBC, and CMP ?-- Unable to tolerate ferrous sulfate 325 mg  ?--We will plan to proceed with IV iron therapy in order to help bolster the patient's blood counts ?--Plan for return to clinic in 4 to 6 weeks time after last dose of IV iron ? ? ?Orders Placed This Encounter  ?Procedures  ? CBC with Differential ([facility] Only)  ?  Standing Status:   Future  ?  Number of Occurrences:   1  ?  Standing Expiration Date:   05/14/2022  ? CMP ([facility] only)  ?  Standing Status:   Future  ?  Number of Occurrences:   1  ?  Standing Expiration Date:   05/14/2022  ? Ferritin  ?  Standing Status:   Future  ?  Number of Occurrences:   1  ?  Standing Expiration Date:   05/14/2022  ? Iron and Iron Binding Capacity  ([facility] only)  ?  Standing Status:   Future  ?  Number of Occurrences:   1  ?  Standing Expiration Date:   05/14/2022  ? Retic Panel  ?  Standing Status:   Future  ?  Number of Occurrences:   1  ?  Standing Expiration

## 2021-05-14 LAB — FERRITIN: Ferritin: 5 ng/mL — ABNORMAL LOW (ref 11–307)

## 2021-05-23 ENCOUNTER — Encounter: Payer: Self-pay | Admitting: Hematology and Oncology

## 2021-05-23 DIAGNOSIS — D500 Iron deficiency anemia secondary to blood loss (chronic): Secondary | ICD-10-CM | POA: Insufficient documentation

## 2021-05-24 ENCOUNTER — Telehealth: Payer: Self-pay | Admitting: Hematology and Oncology

## 2021-05-24 NOTE — Telephone Encounter (Signed)
Scheduled per 05/14 scheduled message, patient has been called and voicemail was left. ?

## 2021-06-01 ENCOUNTER — Other Ambulatory Visit: Payer: Self-pay | Admitting: Hematology and Oncology

## 2021-06-06 ENCOUNTER — Other Ambulatory Visit: Payer: Self-pay | Admitting: Hematology and Oncology

## 2021-06-08 ENCOUNTER — Telehealth: Payer: Self-pay | Admitting: Pharmacy Technician

## 2021-06-08 NOTE — Telephone Encounter (Signed)
Dr. Lorenso Courier, [facility] note:  Auth Submission: no auth needed Payer: [employer] medicare Medication & CPT/J Code(s) submitted: Feraheme (ferumoxytol) [id number] Route of submission (phone, fax, portal): phone: [phone] Auth type: Buy/Bill Units/visits requested: x2 doses Reference number: [id number] Approval from: 06/08/21 to 06/09/22   Patient will be scheduled as soon as possible

## 2021-06-15 ENCOUNTER — Ambulatory Visit (INDEPENDENT_AMBULATORY_CARE_PROVIDER_SITE_OTHER): Payer: Medicare Other | Admitting: *Deleted

## 2021-06-15 VITALS — BP 109/68 | HR 59 | Temp 98.4°F | Resp 16 | Ht 63.0 in | Wt 148.4 lb

## 2021-06-15 DIAGNOSIS — D500 Iron deficiency anemia secondary to blood loss (chronic): Secondary | ICD-10-CM | POA: Diagnosis not present

## 2021-06-15 MED ORDER — SODIUM CHLORIDE 0.9 % IV SOLN
510.0000 mg | Freq: Once | INTRAVENOUS | Status: AC
  Administered 2021-06-15: 510 mg via INTRAVENOUS
  Filled 2021-06-15: qty 17

## 2021-06-15 NOTE — Progress Notes (Signed)
Diagnosis: Iron Deficiency Anemia  Provider:  Marshell Garfinkel, MD  Procedure: Infusion  IV Type: Peripheral, IV Location: L Antecubital  Feraheme (Ferumoxytol), Dose: 510 mg  Infusion Start Time: 8403 pm  Infusion Stop Time: 1530 pm  Post Infusion IV Care: Observation period completed and Peripheral IV Discontinued  Discharge: Condition: Good, Destination: Home . AVS provided to patient.   Performed by:  Oren Beckmann, RN

## 2021-06-17 ENCOUNTER — Ambulatory Visit: Payer: Medicare Other

## 2021-06-23 ENCOUNTER — Ambulatory Visit (AMBULATORY_SURGERY_CENTER): Payer: Medicare Other | Admitting: Gastroenterology

## 2021-06-23 ENCOUNTER — Encounter: Payer: Self-pay | Admitting: Gastroenterology

## 2021-06-23 VITALS — BP 132/65 | HR 61 | Temp 98.9°F | Resp 16 | Ht 62.0 in | Wt 151.0 lb

## 2021-06-23 DIAGNOSIS — K5730 Diverticulosis of large intestine without perforation or abscess without bleeding: Secondary | ICD-10-CM

## 2021-06-23 DIAGNOSIS — K2970 Gastritis, unspecified, without bleeding: Secondary | ICD-10-CM

## 2021-06-23 DIAGNOSIS — D128 Benign neoplasm of rectum: Secondary | ICD-10-CM | POA: Diagnosis not present

## 2021-06-23 DIAGNOSIS — K3189 Other diseases of stomach and duodenum: Secondary | ICD-10-CM | POA: Diagnosis not present

## 2021-06-23 DIAGNOSIS — D509 Iron deficiency anemia, unspecified: Secondary | ICD-10-CM

## 2021-06-23 MED ORDER — SODIUM CHLORIDE 0.9 % IV SOLN: 500.0000 mL | Freq: Once | INTRAVENOUS | Status: DC

## 2021-06-23 NOTE — Op Note (Signed)
[place] Patient Name: Shelly Hammond Procedure Date: 06/23/2021 9:20 AM MRN: [id number] Endoscopist: Nicki Reaper E. Candis Schatz , MD Age: 71 Referring MD:  Date of Birth: 04-15-1950 Gender: Female Account #: [id number] Procedure:                Colonoscopy Indications:              Iron deficiency anemia Medicines:                Monitored Anesthesia Care Procedure:                Pre-Anesthesia Assessment:                           - Prior to the procedure, a History and Physical                            was performed, and patient medications and                            allergies were reviewed. The patient's tolerance of                            previous anesthesia was also reviewed. The risks                            and benefits of the procedure and the sedation                            options and risks were discussed with the patient.                            All questions were answered, and informed consent                            was obtained. Prior Anticoagulants: The patient has                            taken no previous anticoagulant or antiplatelet                            agents. ASA Grade Assessment: III - A patient with                            severe systemic disease. After reviewing the risks                            and benefits, the patient was deemed in                            satisfactory condition to undergo the procedure.                           After obtaining informed consent, the colonoscope  was passed under direct vision. Throughout the                            procedure, the patient's blood pressure, pulse, and                            oxygen saturations were monitored continuously. The                            CF [id number] #[id number] was introduced through the anus                            and advanced to the the cecum, identified by                            appendiceal orifice and ileocecal  valve. The                            colonoscopy was somewhat difficult due to a                            tortuous colon. Successful completion of the                            procedure was aided by using manual pressure. The                            patient tolerated the procedure well. The quality                            of the bowel preparation was good. The ileocecal                            valve, appendiceal orifice, and rectum were                            photographed. The bowel preparation used was Sutab                            via split dose instruction. Scope In: 9:43:58 AM Scope Out: 10:03:15 AM Scope Withdrawal Time: 0 hours 9 minutes 41 seconds  Total Procedure Duration: 0 hours 19 minutes 17 seconds  Findings:                 The perianal and digital rectal examinations were                            normal. Pertinent negatives include normal                            sphincter tone and no palpable rectal lesions.                           A 7 mm polyp was found in the rectum.  The polyp was                            sessile. The polyp was removed with a cold snare.                            Resection and retrieval were complete. Estimated                            blood loss was minimal.                           Multiple small and large-mouthed diverticula were                            found in the sigmoid colon, transverse colon and                            ascending colon.                           The exam was otherwise normal throughout the                            examined colon.                           The retroflexed view of the distal rectum and anal                            verge was normal and showed no anal or rectal                            abnormalities. Complications:            No immediate complications. Estimated Blood Loss:     Estimated blood loss was minimal. Impression:               - One 7 mm polyp in the rectum,  removed with a cold                            snare. Resected and retrieved.                           - Diverticulosis in the sigmoid colon, in the                            transverse colon and in the ascending colon.                           - The distal rectum and anal verge are normal on                            retroflexion view.                           -  No abnormalities to explain iron deficiency on                            her colonoscopy. Suspect her gastritis as the                            source of her iron deficiency anemia Recommendation:           - Patient has a contact number available for                            emergencies. The signs and symptoms of potential                            delayed complications were discussed with the                            patient. Return to normal activities tomorrow.                            Written discharge instructions were provided to the                            patient.                           - Resume previous diet.                           - Continue present medications.                           - Await pathology results.                           - Repeat colonoscopy (date not yet determined) for                            surveillance based on pathology results. Kip Cropp E. Candis Schatz, MD 06/23/2021 10:19:07 AM This report has been signed electronically.

## 2021-06-23 NOTE — Progress Notes (Signed)
[name] History and Physical   Primary Care Physician:  Antony Blackbird, MD   Reason for Procedure:   Iron deficiency anemia  Plan:    EGD/Colonoscopy     HPI: Shelly Hammond is a 71 y.o. female undergoing EGD and colonoscopy to evaluate iron deficiency anemia.  Her father was diagnosed with colon cancer in his 39s.  She had a colonoscopy in 2015 and believes a  polyp was removed.  She has occasional upper abdominal pain, but otherwise denies any chronic GI symptoms or overt GI blood loss.    Past Medical History:  Diagnosis Date   Anemia    Asthma    Benign colon polyp    Hyperlipidemia    Prediabetes     Past Surgical History:  Procedure Laterality Date   LYMPH NODE BIOPSY     neck    Prior to Admission medications   Medication Sig Start Date End Date Taking? Authorizing Provider  aspirin EC 81 MG tablet Take 81 mg by mouth daily.   Yes [provider]  pantoprazole (PROTONIX) 40 MG tablet Take 40 mg by mouth daily. 04/23/21  Yes [provider]    Current Outpatient Medications  Medication Sig Dispense Refill   aspirin EC 81 MG tablet Take 81 mg by mouth daily.     pantoprazole (PROTONIX) 40 MG tablet Take 40 mg by mouth daily.     Current Facility-Administered Medications  Medication Dose Route Frequency Provider Last Rate Last Admin   0.9 %  sodium chloride infusion  500 mL Intravenous Once Daryel November, MD        Allergies as of 06/23/2021 - Review Complete 06/23/2021  Allergen Reaction Noted   Penicillins Rash and Swelling 06/10/2015   Dust mite extract  07/28/2015   Methyldopa Other (See Comments) 06/10/2015   Peanut oil  04/28/2015    Family History  Problem Relation Age of Onset   Diabetes Mother    Colon cancer Father    Aneurysm Brother     Social History   Socioeconomic History   Marital status: Single    Spouse name: Not on file   Number of children: Not on file   Years of education: Not on file    Highest education level: Not on file  Occupational History   Not on file  Tobacco Use   Smoking status: Never   Smokeless tobacco: Never  Vaping Use   Vaping Use: Never used  Substance and Sexual Activity   Alcohol use: No   Drug use: No   Sexual activity: Not Currently    Birth control/protection: None  Other Topics Concern   Not on file  Social History Narrative   Not on file   Social Determinants of Health   Financial Resource Strain: Not on file  Food Insecurity: Not on file  Transportation Needs: Not on file  Physical Activity: Not on file  Stress: Not on file  Social Connections: Not on file  Intimate Partner Violence: Not on file    Review of Systems:  All other review of systems negative except as mentioned in the HPI.  Physical Exam: Vital signs BP 128/66   Pulse 75   Temp 98.9 F (37.2 C)   Ht '5\' 2"'$  (1.575 m)   Wt 151 lb (68.5 kg)   SpO2 100%   BMI 27.62 kg/m   General:   Alert,  Well-developed, well-nourished, pleasant and cooperative in NAD Airway:  Mallampati 2 Lungs:  Clear throughout  to auscultation.   Heart:  Regular rate and rhythm; no murmurs, clicks, rubs,  or gallops. Abdomen:  Soft, nontender and nondistended. Normal bowel sounds.   Neuro/Psych:  Normal mood and affect. A and O x 3   Taneil Lazarus E. Candis Schatz, MD [facility]

## 2021-06-23 NOTE — Progress Notes (Signed)
To PACU Report given to RN VSS BBS=clear

## 2021-06-23 NOTE — Progress Notes (Signed)
Called to room to assist during endoscopic procedure.  Patient ID and intended procedure confirmed with present staff. Received instructions for my participation in the procedure from the performing physician.  

## 2021-06-23 NOTE — Op Note (Addendum)
[place] Patient Name: Shelly Hammond Procedure Date: 06/23/2021 9:21 AM MRN: [id number] Endoscopist: [place]. Candis Schatz , MD Age: 71 Referring MD:  Date of Birth: 1950/09/03 Gender: Female Account #: [id number] Procedure:                Upper GI Endoscopy Indications:              Iron deficiency anemia Medicines:                Monitored Anesthesia Care Procedure:                Pre-Anesthesia Assessment:                           - Prior to the procedure, a History and Physical                            was performed, and patient medications and                            allergies were reviewed. The patient's tolerance of                            previous anesthesia was also reviewed. The risks                            and benefits of the procedure and the sedation                            options and risks were discussed with the patient.                            All questions were answered, and informed consent                            was obtained. Prior Anticoagulants: The patient has                            taken no previous anticoagulant or antiplatelet                            agents. ASA Grade Assessment: III - A patient with                            severe systemic disease. After reviewing the risks                            and benefits, the patient was deemed in                            satisfactory condition to undergo the procedure.                           After obtaining informed consent, the endoscope was  passed under direct vision. Throughout the                            procedure, the patient's blood pressure, pulse, and                            oxygen saturations were monitored continuously. The                            Endoscope was introduced through the mouth, and                            advanced to the third part of duodenum. The upper                            GI endoscopy was  accomplished without difficulty.                            The patient tolerated the procedure. Scope In: Scope Out: Findings:                 The examined esophagus was normal.                           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 The                            proximal extent of the gastric folds (end of                            tubular esophagus) was 31 cm from the incisors. The                            hiatal narrowing was 40 cm from the incisors. The                            Z-line was 39 cm from the incisors.                           Localized moderate inflammation with hemorrhage                            characterized by adherent blood and erythema was                            found in the gastric antrum. Biopsies were taken                            with a cold forceps for Helicobacter pylori                            testing. Estimated blood loss was minimal.  The exam of the stomach was otherwise normal.                           The examined duodenum was normal. Biopsies for                            histology were taken with a cold forceps for                            evaluation of celiac disease. Estimated blood loss                            was minimal. Complications:            No immediate complications. Estimated Blood Loss:     Estimated blood loss was minimal. Impression:               - Normal esophagus.                           - 9 cm paraesophageal hernia.                           - Gastritis with hemorrhage. Biopsied. Suspect this                            is the etiology of the patient's iron deficiency                            anemia                           - Normal examined duodenum. Biopsied. Recommendation:           - Patient has a contact number available for                            emergencies. The signs and symptoms of potential                            delayed complications were  discussed with the                            patient. Return to normal activities tomorrow.                            Written discharge instructions were provided to the                            patient.                           - Resume previous diet.                           - Continue present medications.                           -  Await pathology results.                           - Consider surgical evaluation for elective repair                            of large hiatal hernia Shamir Tuzzolino E. Candis Schatz, MD 06/23/2021 10:12:19 AM This report has been signed electronically.

## 2021-06-23 NOTE — Patient Instructions (Signed)
Upper Endoscopy:  Handout on Gastritis given to you today                                  Await results of gastric biopsies                                   Consider surgical evaluation for large hiatal hernia    Colonoscopy:  Handouts on polyps & diverticulosis given to you                           Await pathology results on polyp removed      YOU HAD AN ENDOSCOPIC PROCEDURE TODAY AT [place]:   Refer to the procedure report that was given to you for any specific questions about what was found during the examination.  If the procedure report does not answer your questions, please call your gastroenterologist to clarify.  If you requested that your care partner not be given the details of your procedure findings, then the procedure report has been included in a sealed envelope for you to review at your convenience later.  YOU SHOULD EXPECT: Some feelings of bloating in the abdomen. Passage of more gas than usual.  Walking can help get rid of the air that was put into your GI tract during the procedure and reduce the bloating. If you had a lower endoscopy (such as a colonoscopy or flexible sigmoidoscopy) you may notice spotting of blood in your stool or on the toilet paper. If you underwent a bowel prep for your procedure, you may not have a normal bowel movement for a few days.  Please Note:  You might notice some irritation and congestion in your nose or some drainage.  This is from the oxygen used during your procedure.  There is no need for concern and it should clear up in a day or so.  SYMPTOMS TO REPORT IMMEDIATELY:  Following lower endoscopy (colonoscopy or flexible sigmoidoscopy):  Excessive amounts of blood in the stool  Significant tenderness or worsening of abdominal pains  Swelling of the abdomen that is new, acute  Fever of 100F or higher  Following upper endoscopy (EGD)  Vomiting of blood or coffee ground material  New chest pain or pain under  the shoulder blades  Painful or persistently difficult swallowing  New shortness of breath  Fever of 100F or higher  Black, tarry-looking stools  For urgent or emergent issues, a gastroenterologist can be reached at any hour by calling [phone]. Do not use MyChart messaging for urgent concerns.    DIET:  We do recommend a small meal at first, but then you may proceed to your regular diet.  Drink plenty of fluids but you should avoid alcoholic beverages for 24 hours.  ACTIVITY:  You should plan to take it easy for the rest of today and you should NOT DRIVE or use heavy machinery until tomorrow (because of the sedation medicines used during the test).    FOLLOW UP: Our staff will call the number listed on your records 24-72 hours following your procedure to check on you and address any questions or concerns that you may have regarding the information given to you following your procedure. If we do not reach you, we will leave a message.  We will attempt to reach you two times.  During this call, we will ask if you have developed any symptoms of COVID 19. If you develop any symptoms (ie: fever, flu-like symptoms, shortness of breath, cough etc.) before then, please call [phone].  If you test positive for Covid 19 in the 2 weeks post procedure, please call and report this information to [place].    If any biopsies were taken you will be contacted by phone or by letter within the next 1-3 weeks.  Please call us at [phone] if you have not heard about the biopsies in 3 weeks.    SIGNATURES/CONFIDENTIALITY: You and/or your care partner have signed paperwork which will be entered into your electronic medical record.  These signatures attest to the fact that that the information above on your After Visit Summary has been reviewed and is understood.  Full responsibility of the confidentiality of this discharge information lies with you and/or your care-partner.

## 2021-06-24 ENCOUNTER — Telehealth: Payer: Self-pay | Admitting: *Deleted

## 2021-06-24 ENCOUNTER — Ambulatory Visit: Payer: Medicare Other

## 2021-06-24 ENCOUNTER — Telehealth: Payer: Self-pay

## 2021-06-24 NOTE — Telephone Encounter (Signed)
No answer, left message to call back later today, B.Lativia Velie RN. 

## 2021-06-24 NOTE — Telephone Encounter (Signed)
Patient called back and stated she was doing great,

## 2021-06-24 NOTE — Telephone Encounter (Signed)
No answer on second attempt follow up call.

## 2021-06-25 ENCOUNTER — Other Ambulatory Visit: Payer: Self-pay

## 2021-06-25 ENCOUNTER — Encounter: Payer: Self-pay | Admitting: Gastroenterology

## 2021-06-25 ENCOUNTER — Telehealth: Payer: Self-pay

## 2021-06-25 DIAGNOSIS — D509 Iron deficiency anemia, unspecified: Secondary | ICD-10-CM

## 2021-06-25 DIAGNOSIS — K449 Diaphragmatic hernia without obstruction or gangrene: Secondary | ICD-10-CM

## 2021-06-25 NOTE — Telephone Encounter (Signed)
-----   Message from Daryel November, MD sent at 06/25/2021  3:09 PM EDT ----- Vaughan Basta, Can you please enter a general surgery consult (Dr. Redmond Pulling or Dr. Hassell Done) for possible repair of paraesophageal hernia and order a CT scan of the chest and abdomen with contrast?  Thanks

## 2021-06-25 NOTE — Telephone Encounter (Signed)
Order entered for CT of CAP, rad scheduling to contact pt to schedule this appt. Referral faxed to [facility].

## 2021-06-28 ENCOUNTER — Ambulatory Visit (INDEPENDENT_AMBULATORY_CARE_PROVIDER_SITE_OTHER): Payer: Medicare Other

## 2021-06-28 VITALS — BP 114/71 | HR 61 | Temp 98.7°F | Resp 18 | Ht 63.5 in | Wt 148.0 lb

## 2021-06-28 DIAGNOSIS — D500 Iron deficiency anemia secondary to blood loss (chronic): Secondary | ICD-10-CM

## 2021-06-28 MED ORDER — SODIUM CHLORIDE 0.9 % IV SOLN
510.0000 mg | Freq: Once | INTRAVENOUS | Status: AC
  Administered 2021-06-28: 510 mg via INTRAVENOUS
  Filled 2021-06-28: qty 17

## 2021-06-28 NOTE — Progress Notes (Signed)
Diagnosis: Iron Deficiency Anemia  Provider:  Marshell Garfinkel, MD  Procedure: Infusion  IV Type: Peripheral, IV Location: L Antecubital  Feraheme (Ferumoxytol), Dose: 510  Infusion Start Time: 0920  Infusion Stop Time: 0937  Post Infusion IV Care: Peripheral IV Discontinued  Discharge: Condition: Good, Destination: Home . AVS provided to patient.   Performed by:  Arnoldo Morale, RN

## 2021-07-21 ENCOUNTER — Inpatient Hospital Stay: Payer: Medicare Other

## 2021-07-21 ENCOUNTER — Inpatient Hospital Stay: Payer: Medicare Other | Admitting: Hematology and Oncology

## 2021-07-27 ENCOUNTER — Inpatient Hospital Stay (HOSPITAL_BASED_OUTPATIENT_CLINIC_OR_DEPARTMENT_OTHER): Payer: Medicare Other | Admitting: Hematology and Oncology

## 2021-07-27 ENCOUNTER — Other Ambulatory Visit: Payer: Self-pay

## 2021-07-27 ENCOUNTER — Other Ambulatory Visit: Payer: Self-pay | Admitting: Hematology and Oncology

## 2021-07-27 ENCOUNTER — Inpatient Hospital Stay: Payer: Medicare Other | Attending: Hematology and Oncology

## 2021-07-27 ENCOUNTER — Encounter: Payer: Self-pay | Admitting: Hematology and Oncology

## 2021-07-27 VITALS — BP 124/73 | HR 60 | Temp 98.0°F | Resp 15 | Wt 145.8 lb

## 2021-07-27 DIAGNOSIS — D509 Iron deficiency anemia, unspecified: Secondary | ICD-10-CM | POA: Diagnosis present

## 2021-07-27 DIAGNOSIS — D500 Iron deficiency anemia secondary to blood loss (chronic): Secondary | ICD-10-CM | POA: Diagnosis not present

## 2021-07-27 DIAGNOSIS — Z79899 Other long term (current) drug therapy: Secondary | ICD-10-CM | POA: Diagnosis not present

## 2021-07-27 LAB — CMP (CANCER CENTER ONLY)
ALT: 11 U/L (ref 0–44)
AST: 14 U/L — ABNORMAL LOW (ref 15–41)
Albumin: 4.3 g/dL (ref 3.5–5.0)
Alkaline Phosphatase: 68 U/L (ref 38–126)
Anion gap: 5 (ref 5–15)
BUN: 18 mg/dL (ref 8–23)
CO2: 30 mmol/L (ref 22–32)
Calcium: 9.5 mg/dL (ref 8.9–10.3)
Chloride: 104 mmol/L (ref 98–111)
Creatinine: 1.01 mg/dL — ABNORMAL HIGH (ref 0.44–1.00)
GFR, Estimated: >60 mL/min (ref >60)
Glucose, Bld: 95 mg/dL (ref 70–99)
Potassium: 3.8 mmol/L (ref 3.5–5.1)
Sodium: 139 mmol/L (ref 135–145)
Total Bilirubin: 0.4 mg/dL (ref 0.3–1.2)
Total Protein: 7.7 g/dL (ref 6.5–8.1)

## 2021-07-27 LAB — CBC WITH DIFFERENTIAL (CANCER CENTER ONLY)
Abs Immature Granulocytes: 0.01 10*3/uL (ref 0.00–0.07)
Basophils Absolute: 0 10*3/uL (ref 0.0–0.1)
Basophils Relative: 1 %
Eosinophils Absolute: 0.1 10*3/uL (ref 0.0–0.5)
Eosinophils Relative: 4 %
HCT: 36.5 % (ref 36.0–46.0)
Hemoglobin: 11.8 g/dL — ABNORMAL LOW (ref 12.0–15.0)
Immature Granulocytes: 0 %
Lymphocytes Relative: 25 %
Lymphs Abs: 0.8 10*3/uL (ref 0.7–4.0)
MCH: 26.5 pg (ref 26.0–34.0)
MCHC: 32.3 g/dL (ref 30.0–36.0)
MCV: 82 fL (ref 80.0–100.0)
Monocytes Absolute: 0.4 10*3/uL (ref 0.1–1.0)
Monocytes Relative: 13 %
Neutro Abs: 1.9 10*3/uL (ref 1.7–7.7)
Neutrophils Relative %: 57 %
Platelet Count: 337 10*3/uL (ref 150–400)
RBC: 4.45 MIL/uL (ref 3.87–5.11)
WBC Count: 3.3 10*3/uL — ABNORMAL LOW (ref 4.0–10.5)
nRBC: 0 % (ref 0.0–0.2)

## 2021-07-27 LAB — RETIC PANEL
Immature Retic Fract: 8 % (ref 2.3–15.9)
RBC.: 4.47 MIL/uL (ref 3.87–5.11)
Retic Count, Absolute: 34.4 10*3/uL (ref 19.0–186.0)
Retic Ct Pct: 0.8 % (ref 0.4–3.1)
Reticulocyte Hemoglobin: 34.2 pg (ref >27.9)

## 2021-07-27 LAB — IRON AND IRON BINDING CAPACITY (CC-WL,HP ONLY)
Iron: 79 ug/dL (ref 28–170)
Saturation Ratios: 23 % (ref 10.4–31.8)
TIBC: 346 ug/dL (ref 250–450)
UIBC: 267 ug/dL (ref 148–442)

## 2021-07-27 LAB — FERRITIN: Ferritin: 152 ng/mL (ref 11–307)

## 2021-07-27 NOTE — Progress Notes (Signed)
[place] [phone]   [phone]  PROGRESS NOTE  Patient Care Team: Antony Blackbird, MD as PCP - General (Family Medicine)  Hematological/Oncological History # Iron Deficiency Anemia 2/2 to GI Bleeding.  05/03/2021: white blood cell count 3.3, hemoglobin 8.7, MCV 73, and platelets of 154.  Additionally labs showed iron binding capacity 525, and iron saturation of 4%. 05/13/2021: Establish care with Dr. Lorenso Courier 06/15/2021-06/28/2021: Feraheme '510mg'$  IV X 2 doses.   07/27/2021: White blood cell 3.3, hemoglobin 11.8, MCV 82, and platelets of 337  Interval History:  Shelly Hammond 71 y.o. female with medical history significant for iron deficiency anemia from GI bleeding who presents for a follow up visit. The patient's last visit was on 05/13/2021 at which time she establish care. In the interim since the last visit she received 2 doses of IV Feraheme in June 2023.  On exam today Shelly Hammond reports she tolerated the iron infusion well without any side effects.  She not have any headache, itching, or rash.  She reports that she does not feel much of a boost in her energy.  She reports her energy is about the same and reports is about a 8 or 9 out of 10.  She also received 2 vitamin B12 shots.  She notes that her vertigo is "coming back" but she does not think it is related to her blood levels.  She reports that the endoscopy and colonoscopy went quite well and she was surprised to learn about the hiatal hernia.  She thinks it makes sense due to her difficulty with fried) and foods.  She otherwise denies any fevers, chills, sweats, nausea, vomiting or diarrhea.  A full 10 point ROS is listed below.  MEDICAL HISTORY:  Past Medical History:  Diagnosis Date   Anemia    Asthma    Benign colon polyp    Hyperlipidemia    Prediabetes     SURGICAL HISTORY: Past Surgical History:  Procedure Laterality Date   LYMPH NODE BIOPSY     neck    SOCIAL HISTORY: Social History    Socioeconomic History   Marital status: Single    Spouse name: Not on file   Number of children: Not on file   Years of education: Not on file   Highest education level: Not on file  Occupational History   Not on file  Tobacco Use   Smoking status: Never   Smokeless tobacco: Never  Vaping Use   Vaping Use: Never used  Substance and Sexual Activity   Alcohol use: No   Drug use: No   Sexual activity: Not Currently    Birth control/protection: None  Other Topics Concern   Not on file  Social History Narrative   Not on file   Social Determinants of Health   Financial Resource Strain: Not on file  Food Insecurity: Not on file  Transportation Needs: Not on file  Physical Activity: Not on file  Stress: Not on file  Social Connections: Not on file  Intimate Partner Violence: Not on file    FAMILY HISTORY: Family History  Problem Relation Age of Onset   Diabetes Mother    Colon cancer Father    Aneurysm Brother     ALLERGIES:  is allergic to penicillins, dust mite extract, methyldopa, and peanut oil.  MEDICATIONS:  Current Outpatient Medications  Medication Sig Dispense Refill   aspirin EC 81 MG tablet Take 81 mg by mouth daily.     pantoprazole (PROTONIX) 40  MG tablet Take 80 mg by mouth 2 (two) times daily.     rosuvastatin (CRESTOR) 5 MG tablet Take 5 mg by mouth daily.     No current facility-administered medications for this visit.    REVIEW OF SYSTEMS:   Constitutional: ( - ) fevers, ( - )  chills , ( - ) night sweats Eyes: ( - ) blurriness of vision, ( - ) double vision, ( - ) watery eyes Ears, nose, mouth, throat, and face: ( - ) mucositis, ( - ) sore throat Respiratory: ( - ) cough, ( - ) dyspnea, ( - ) wheezes Cardiovascular: ( - ) palpitation, ( - ) chest discomfort, ( - ) lower extremity swelling Gastrointestinal:  ( - ) nausea, ( - ) heartburn, ( - ) change in bowel habits Skin: ( - ) abnormal skin rashes Lymphatics: ( - ) new lymphadenopathy, ( -  ) easy bruising Neurological: ( - ) numbness, ( - ) tingling, ( - ) new weaknesses Behavioral/Psych: ( - ) mood change, ( - ) new changes  All other systems were reviewed with the patient and are negative.  PHYSICAL EXAMINATION:  Vitals:   07/27/21 0908  BP: 124/73  Pulse: 60  Resp: 15  Temp: 98 F (36.7 C)  SpO2: 98%   Filed Weights   07/27/21 0908  Weight: 145 lb 12.8 oz (66.1 kg)    GENERAL: Well-appearing elderly Caucasian female, alert, no distress and comfortable SKIN: skin color, texture, turgor are normal, no rashes or significant lesions EYES: conjunctiva are pink and non-injected, sclera clear LUNGS: clear to auscultation and percussion with normal breathing effort HEART: regular rate & rhythm and no murmurs and no lower extremity edema Musculoskeletal: no cyanosis of digits and no clubbing  PSYCH: alert & oriented x 3, fluent speech NEURO: no focal motor/sensory deficits  LABORATORY DATA:  I have reviewed the data as listed    Latest Ref Rng & Units 07/27/2021    8:44 AM 05/13/2021    2:42 PM 05/04/2017    9:57 AM  CBC  WBC 4.0 - 10.5 K/uL 3.3  5.4  5.9   Hemoglobin 12.0 - 15.0 g/dL 11.8  8.3  12.0   Hematocrit 36.0 - 46.0 % 36.5  29.5  36.3   Platelets 150 - 400 K/uL 337  521  503        Latest Ref Rng & Units 07/27/2021    8:44 AM 05/13/2021    2:42 PM 05/04/2017    9:57 AM  CMP  Glucose 70 - 99 mg/dL 95  131  92   BUN 8 - 23 mg/dL '18  20  12   '$ Creatinine 0.44 - 1.00 mg/dL 1.01  0.98  0.82   Sodium 135 - 145 mmol/L 139  141  140   Potassium 3.5 - 5.1 mmol/L 3.8  3.5  4.1   Chloride 98 - 111 mmol/L 104  106  103   CO2 22 - 32 mmol/L '30  29  30   '$ Calcium 8.9 - 10.3 mg/dL 9.5  8.9  9.1   Total Protein 6.5 - 8.1 g/dL 7.7  7.8  7.6   Total Bilirubin 0.3 - 1.2 mg/dL 0.4  0.3  0.5   Alkaline Phos 38 - 126 U/L 68  84    AST 15 - 41 U/L '14  21  13   '$ ALT 0 - 44 U/L '11  19  9     '$ RADIOGRAPHIC STUDIES: No results found.  ASSESSMENT & PLAN Shelly Hammond 71  y.o. female with medical history significant for iron deficiency anemia of unclear etiology who presents for a follow up visit.   # Iron Deficiency Anemia 2/2 to GI Bleeding. -- Findings are consistent with iron deficiency anemia secondary to a GI bleed -- Patient underwent endoscopy and colonoscopy in June 2023.  EGD showed localized moderate inflammation with hemorrhage characterized by adherent blood and erythema found in the gastric antrum.  Biopsies were taken. --today will order iron panel and ferritin as well as reticulocytes, CBC, and CMP -- Unable to tolerate ferrous sulfate 325 mg  --Patient received 2 doses of IV Feraheme 510 mg in June 2023. --Labs today show white blood cell count 3.3, hemoglobin 1.8, MCV 82, and platelets of 337.  Iron studies pending --Plan for return to clinic in 6 months time to reevaluate  No orders of the defined types were placed in this encounter.   All questions were answered. The patient knows to call the clinic with any problems, questions or concerns.  A total of more than 30 minutes were spent on this encounter with face-to-face time and non-face-to-face time, including preparing to see the patient, ordering tests and/or medications, counseling the patient and coordination of care as outlined above.   Ledell Peoples, MD Department of Hematology/Oncology [place] at [facility] Phone: [phone] Pager: [phone] Email: [email or web address]  07/27/2021 10:37 AM

## 2021-07-28 ENCOUNTER — Telehealth: Payer: Self-pay | Admitting: Hematology and Oncology

## 2021-07-28 NOTE — Telephone Encounter (Signed)
Scheduled per 7/18 los, message has been left

## 2021-09-17 ENCOUNTER — Encounter: Payer: Self-pay | Admitting: Hematology and Oncology

## 2022-01-13 ENCOUNTER — Encounter: Payer: Self-pay | Admitting: Hematology and Oncology

## 2022-01-13 ENCOUNTER — Ambulatory Visit (HOSPITAL_COMMUNITY)
Admission: EM | Admit: 2022-01-13 | Discharge: 2022-01-13 | Disposition: A | Discharge status: Home / Self Care | Payer: Medicare HMO | Attending: Emergency Medicine | Admitting: Emergency Medicine

## 2022-01-13 ENCOUNTER — Encounter (HOSPITAL_COMMUNITY): Payer: Self-pay

## 2022-01-13 DIAGNOSIS — R110 Nausea: Secondary | ICD-10-CM | POA: Diagnosis not present

## 2022-01-13 DIAGNOSIS — K219 Gastro-esophageal reflux disease without esophagitis: Secondary | ICD-10-CM

## 2022-01-13 DIAGNOSIS — Z8719 Personal history of other diseases of the digestive system: Secondary | ICD-10-CM | POA: Diagnosis not present

## 2022-01-13 HISTORY — DX: Acute myocardial infarction, unspecified: I21.9

## 2022-01-13 LAB — CBG MONITORING, ED: Glucose-Capillary: 107 mg/dL — ABNORMAL HIGH (ref 70–99)

## 2022-01-13 MED ORDER — ALUM & MAG HYDROXIDE-SIMETH 200-200-20 MG/5ML PO SUSP
ORAL | Status: AC
  Filled 2022-01-13: qty 30

## 2022-01-13 MED ORDER — LIDOCAINE VISCOUS HCL 2 % MT SOLN
15.0000 mL | Freq: Once | OROMUCOSAL | Status: AC
  Administered 2022-01-13: 15 mL via OROMUCOSAL

## 2022-01-13 MED ORDER — LIDOCAINE VISCOUS HCL 2 % MT SOLN
OROMUCOSAL | Status: AC
  Filled 2022-01-13: qty 15

## 2022-01-13 MED ORDER — ONDANSETRON 4 MG PO TBDP
4.0000 mg | ORAL_TABLET | Freq: Once | ORAL | Status: AC
  Administered 2022-01-13: 4 mg via ORAL

## 2022-01-13 MED ORDER — ALUM & MAG HYDROXIDE-SIMETH 200-200-20 MG/5ML PO SUSP
30.0000 mL | Freq: Once | ORAL | Status: AC
  Administered 2022-01-13: 30 mL via ORAL

## 2022-01-13 NOTE — Discharge Instructions (Addendum)
Please try to adhere to a bland diet.   I suggest that you would follow up with your PCP. Please continue taking the pantoprazole as prescribed.   If you are concerned about carbon monoxide and having worsening/severe symptoms please go to your nearest Emergency Department.

## 2022-01-13 NOTE — ED Triage Notes (Signed)
Chief Complaint: chest pain and flu positive. Patient has history of asthma. Patient having dizziness. Patient also worried about carbon monoxide poisoning  due to her heating system being out. Patient has weakness and confusion. Emesis as well.   Onset: 1 week   Prescriptions or OTC medications tried: No    Sick exposure: Yes- her cousin had the flu   New foods, medications, or products: No  Recent Travel: No

## 2022-01-13 NOTE — ED Provider Notes (Signed)
[place]    CSN: [id number] Arrival date & time: 01/13/22  1452      History   Chief Complaint Chief Complaint  Patient presents with   Cough   Emesis   Weakness    HPI Shelly Hammond is a 72 y.o. female.  Patient presents complaining of intermittent nausea that has been ongoing this past week.  Patient reports she noticed some intermittent left-sided chest pain that started today.  Patient reports that she is concerned for carbon monoxide poisoning due to her heating system being broken.  Patient reports she had the flu this past week, she states that her viral symptoms have improved.  Patient reports history of myocardial infarction and hiatal hernia.  She reports that she does not take any medications for symptoms.    Cough Associated symptoms: chest pain (Intermittent left sided)   Associated symptoms: no chills, no fever, no shortness of breath and no wheezing   Emesis Associated symptoms: no abdominal pain, no chills, no cough, no diarrhea and no fever   Weakness Associated symptoms: chest pain (Intermittent left sided) and nausea   Associated symptoms: no abdominal pain, no cough, no diarrhea, no dysuria, no fever, no frequency, no shortness of breath and no vomiting     Past Medical History:  Diagnosis Date   Anemia    Asthma    Benign colon polyp    Heart attack ([place])    Hyperlipidemia    Prediabetes     Patient Active Problem List   Diagnosis Date Noted   Iron deficiency anemia due to chronic blood loss 05/23/2021   Seasonal allergic rhinitis due to pollen 05/04/2017   Osteopenia determined by x-ray 03/30/2017   Corn of foot 02/28/2017   History of iron deficiency anemia 02/28/2017   Family history of colon cancer in father 02/28/2017   Screening for colon cancer 02/28/2017   Coronary artery disease involving native heart without angina pectoris 02/28/2017   History of myocardial infarct at age less than 17 years 02/28/2017   Overweight (BMI  25.0-29.9) 02/28/2017   Prediabetes    Cataracts, bilateral 07/22/2015   PVD (posterior vitreous detachment), both eyes 07/22/2015   Neck mass 06/30/2015   Vitamin D deficiency 06/19/2015   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      Home Medications    Prior to Admission medications   Medication Sig Start Date End Date Taking? Authorizing Provider  aspirin EC 81 MG tablet Take 81 mg by mouth daily.   Yes [provider]  pantoprazole (PROTONIX) 40 MG tablet Take 80 mg by mouth 2 (two) times daily. 04/23/21   [provider]  rosuvastatin (CRESTOR) 5 MG tablet Take 5 mg by mouth daily. 07/08/21   [provider]    Family History Family History  Problem Relation Age of Onset   Diabetes Mother    Colon cancer Father    Aneurysm Brother     Social History Social History   Tobacco Use   Smoking status: Never   Smokeless tobacco: Never  Vaping Use   Vaping Use: Never used  Substance Use Topics   Alcohol use: No   Drug use: No     Allergies   Penicillins, Dust mite extract, Methyldopa, Peanut oil, and Shellfish allergy   Review of Systems Review  of Systems  Constitutional:  Positive for appetite change. Negative for activity change, chills, fatigue and fever.  Respiratory:  Negative for cough, chest tightness, shortness of breath and wheezing.   Cardiovascular:  Positive for chest pain (Intermittent left sided). Negative for palpitations.  Gastrointestinal:  Positive for nausea. Negative for abdominal distention, abdominal pain, constipation, diarrhea and vomiting.  Endocrine: Negative.   Genitourinary: Negative.  Negative for difficulty urinating, dysuria, flank pain, frequency, hematuria and pelvic pain.  Neurological:  Positive for weakness.     Physical Exam Triage Vital Signs ED Triage Vitals  Enc  Vitals Group     BP 01/13/22 1754 126/75     Pulse Rate 01/13/22 1754 89     Resp 01/13/22 1754 16     Temp 01/13/22 1754 98.3 F (36.8 C)     Temp Source 01/13/22 1754 Oral     SpO2 01/13/22 1754 95 %     Weight --      Height --      Head Circumference --      Peak Flow --      Pain Score 01/13/22 1751 4     Pain Loc --      Pain Edu? --      Excl. in [place]? --    No data found.  Updated Vital Signs BP 124/77 (BP Location: Left Arm)   Pulse 89   Temp 98.3 F (36.8 C) (Oral)   Resp 16   SpO2 95%    Physical Exam Vitals and nursing note reviewed.  Constitutional:      Appearance: Normal appearance.  Cardiovascular:     Rate and Rhythm: Normal rate and regular rhythm.     Heart sounds: Normal heart sounds, S1 normal and S2 normal.  Pulmonary:     Effort: Pulmonary effort is normal.     Breath sounds: Normal breath sounds and air entry. No decreased breath sounds, wheezing, rhonchi or rales.  Abdominal:     General: Abdomen is flat. Bowel sounds are normal. There is no distension. There are no signs of injury.     Palpations: Abdomen is soft. There is no shifting dullness, fluid wave, hepatomegaly, splenomegaly, mass or pulsatile mass.     Tenderness: There is abdominal tenderness in the epigastric area. There is no guarding or rebound. Negative signs include Murphy's sign and McBurney's sign.  Neurological:     General: No focal deficit present.     Mental Status: She is alert.     GCS: GCS eye subscore is 4. GCS verbal subscore is 5. GCS motor subscore is 6.      UC Treatments / Results  Labs (all labs ordered are listed, but only abnormal results are displayed) Labs Reviewed  CBG MONITORING, ED - Abnormal; Notable for the following components:      Result Value   Glucose-Capillary 107 (*)    All other components within normal limits    EKG   Radiology No results found.  Procedures Procedures (including critical care time)  Medications Ordered in  UC Medications  ondansetron (ZOFRAN-ODT) disintegrating tablet 4 mg (4 mg Oral Given by Other 01/13/22 1943)  alum & mag hydroxide-simeth (MAALOX/MYLANTA) 200-200-20 MG/5ML suspension 30 mL (30 mLs Oral Given by Other 01/13/22 1943)  lidocaine (XYLOCAINE) 2 % viscous mouth solution 15 mL (15 mLs Mouth/Throat Given by Other 01/13/22 1943)    Initial Impression / Assessment and Plan / UC Course  I have reviewed the triage vital signs and  the nursing notes.  Pertinent labs & imaging results that were available during my care of the patient were reviewed by me and considered in my medical decision making (see chart for details).     Patient was evaluated for nausea and GERD.  Zofran given in office. GI cocktail given in office. EKG showed normal sinus rhythm. CBG was 107.  Patient was given snack and tolerated fluids.  She was made aware to adhere to a bland diet and continue with PPI medicine.  Patient was made aware to schedule follow-up with her PCP. Patient made aware that she will need to present to Emergency Department if she wants to receive testing for carbon monoxide.  Patient verbalized understanding of instructions.  Charting was provided using a a verbal dictation system, charting was proofread for errors, errors may occur which could change the meaning of the information charted.   Final Clinical Impressions(s) / UC Diagnoses   Final diagnoses:  Nausea without vomiting  Gastroesophageal reflux disease without esophagitis  History of hiatal hernia     Discharge Instructions      Please try to adhere to a bland diet.   I suggest that you would follow up with your PCP. Please continue taking the pantoprazole as prescribed.   If you are concerned about carbon monoxide and having worsening/severe symptoms please go to your nearest Emergency Department.      ED Prescriptions   None    PDMP not reviewed this encounter.   Flossie Dibble, NP 01/14/22 0008

## 2022-01-28 ENCOUNTER — Inpatient Hospital Stay: Payer: Medicare HMO | Attending: Family Medicine

## 2022-01-28 ENCOUNTER — Other Ambulatory Visit: Payer: Self-pay | Admitting: Hematology and Oncology

## 2022-01-28 ENCOUNTER — Inpatient Hospital Stay: Payer: Medicare HMO | Admitting: Hematology and Oncology

## 2022-01-28 DIAGNOSIS — D500 Iron deficiency anemia secondary to blood loss (chronic): Secondary | ICD-10-CM

## 2022-01-31 ENCOUNTER — Telehealth: Payer: Self-pay | Admitting: Hematology and Oncology

## 2022-01-31 NOTE — Telephone Encounter (Signed)
Per 1/19 IB, msg left

## 2022-02-14 ENCOUNTER — Inpatient Hospital Stay: Payer: Medicare HMO | Attending: Family Medicine

## 2022-02-14 ENCOUNTER — Inpatient Hospital Stay: Payer: Medicare HMO | Admitting: Hematology and Oncology

## 2023-08-26 IMAGING — MG MM DIGITAL SCREENING BILAT W/ TOMO AND CAD
6 of 10 series · 6 of 30 positions shown · non-contrast
Comparison: Previous exam(s).
COMPARISON: Previous exam(s).

Addendum:
CLINICAL DATA: Screening.

EXAM:
DIGITAL SCREENING BILATERAL MAMMOGRAM WITH TOMOSYNTHESIS AND CAD
TECHNIQUE: Bilateral screening digital craniocaudal and mediolateral oblique
mammograms were obtained. Bilateral screening digital breast
tomosynthesis was performed. The images were evaluated with
computer-aided detection.

[R MLO synth-2D (1 of 2)]
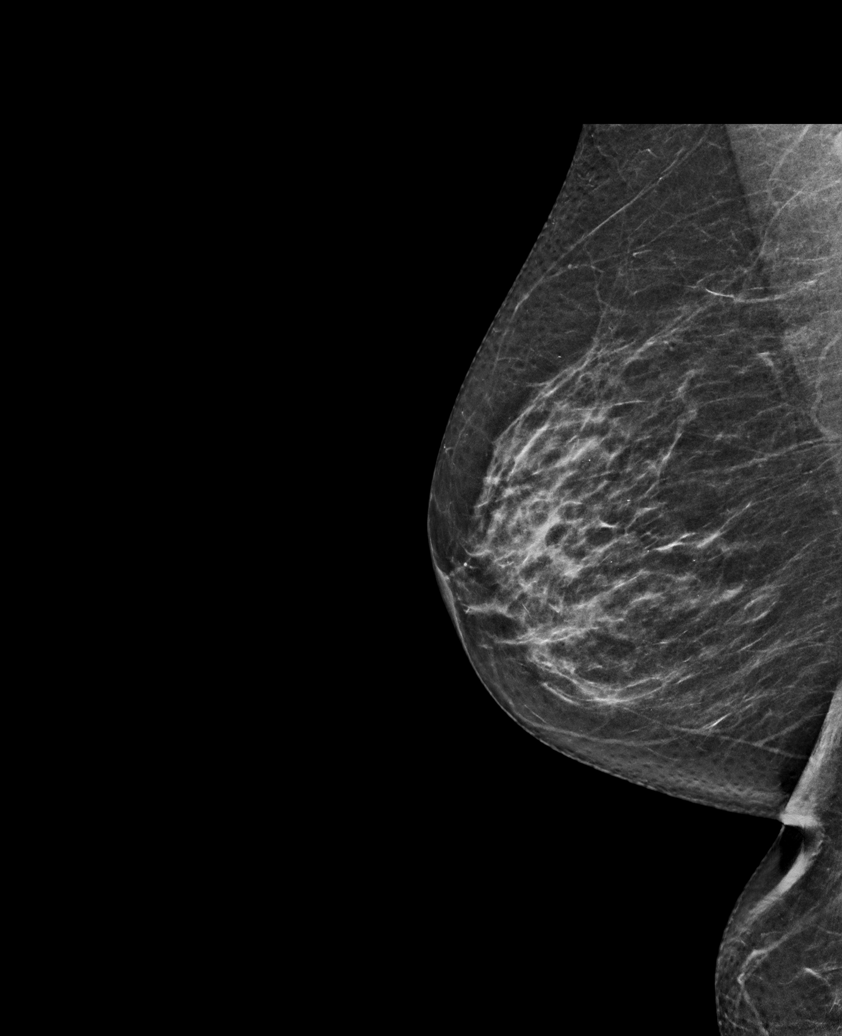

[R CC synth-2D]
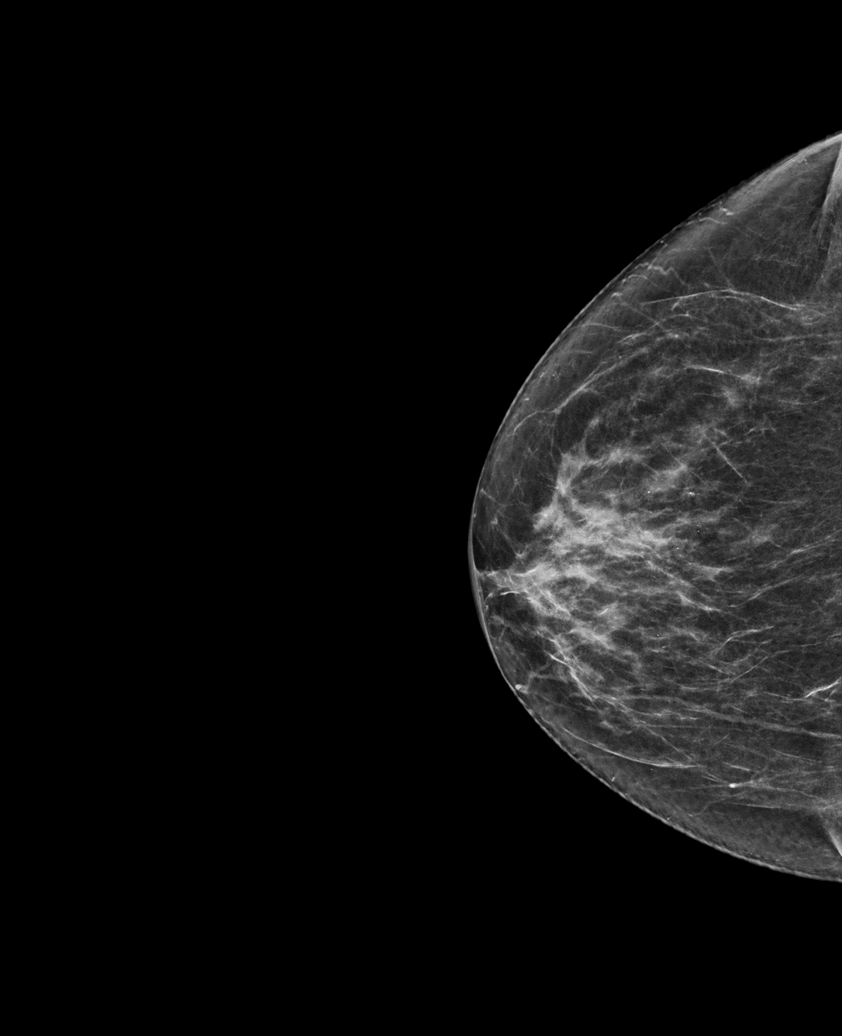

[R MLO synth-2D (2 of 2)]
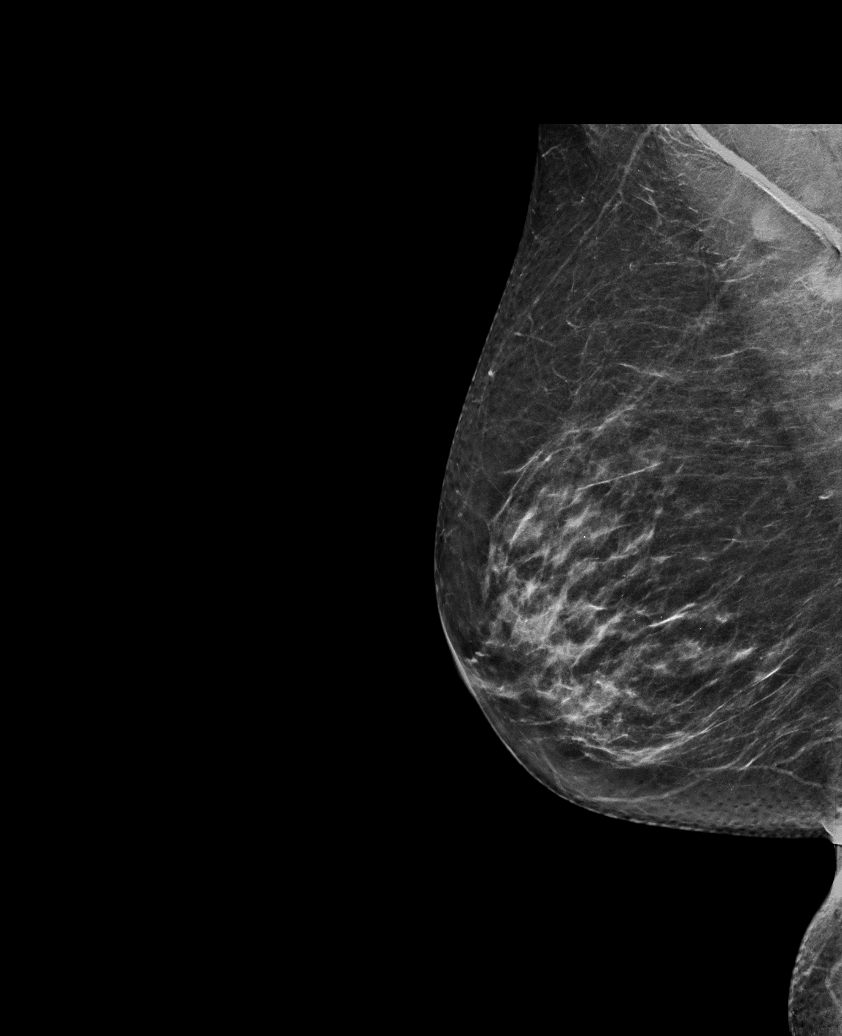

[L MLO synth-2D]
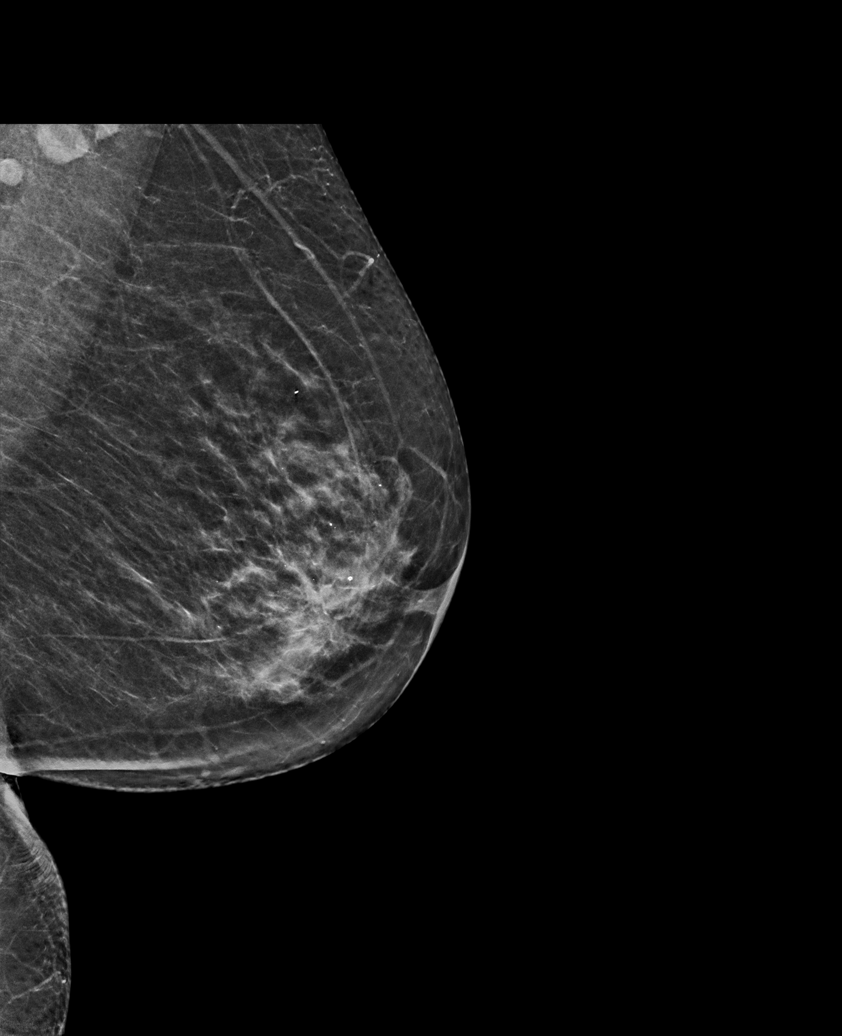

[L CC synth-2D]
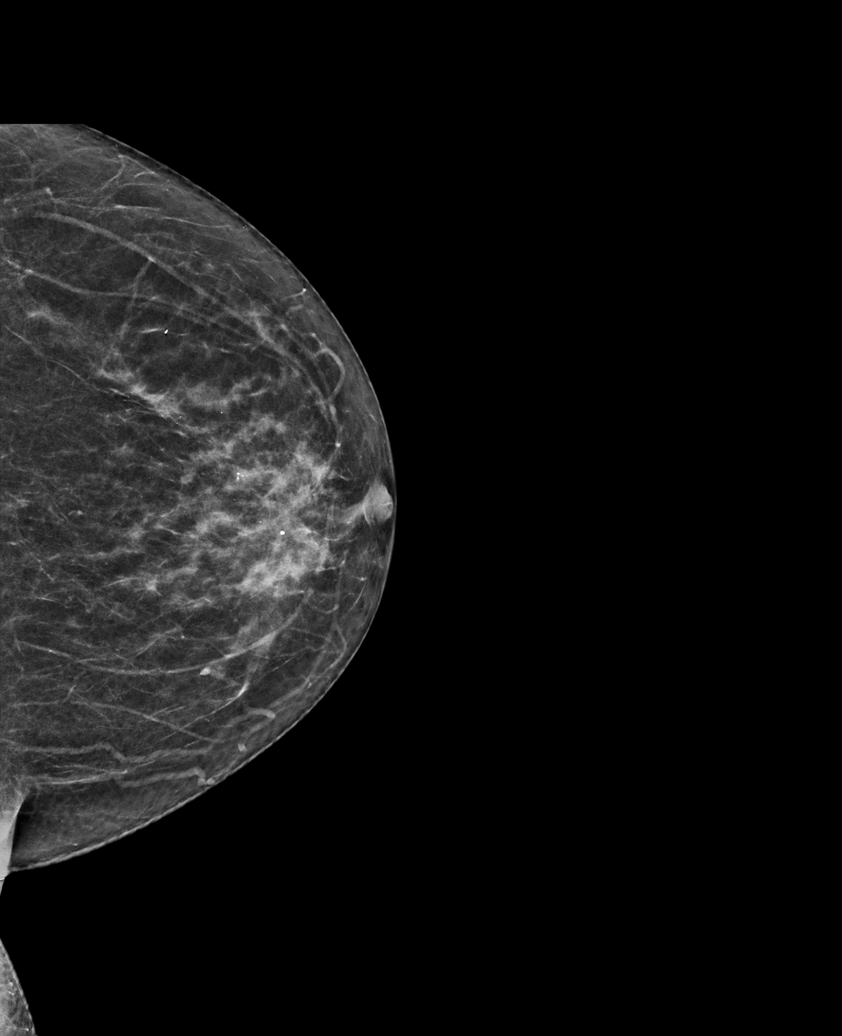

[R CC tomo · tomo slice 32/63.0]
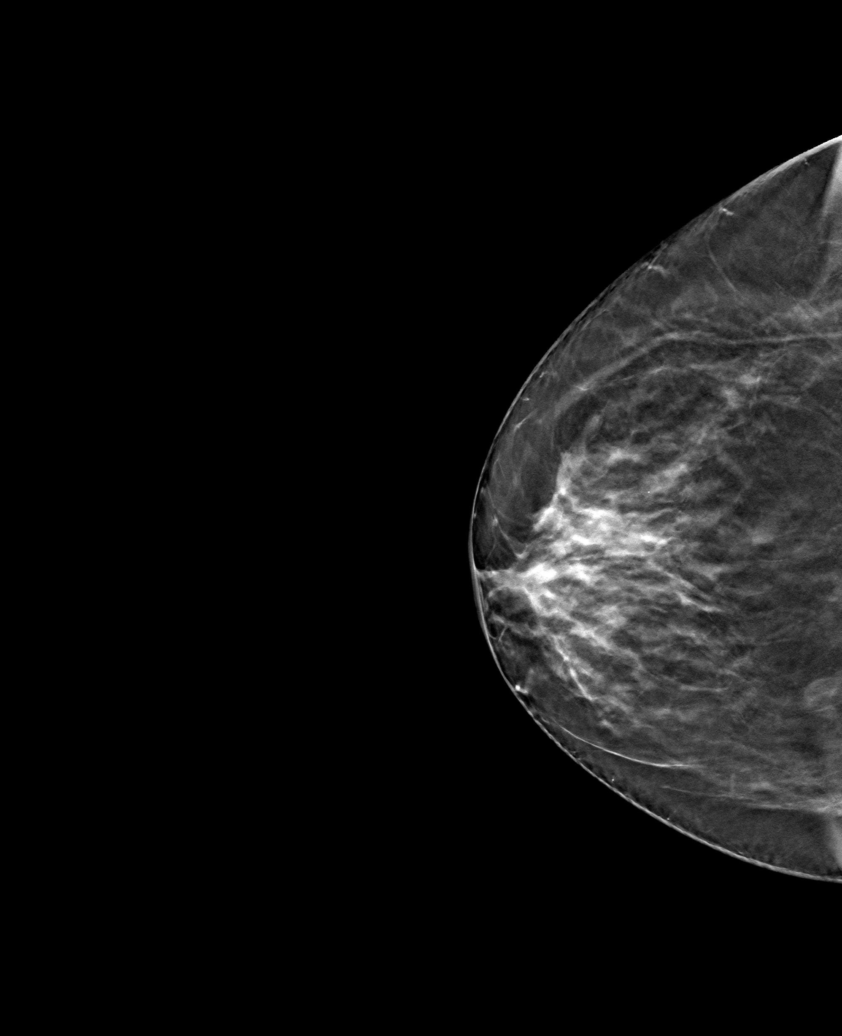

[6 of 30 positions shown; findings below may reference images not displayed]

ACR Breast Density Category c: The breast tissue is heterogeneously
dense, which may obscure small masses.
FINDINGS: In the left breast, a possible asymmetry warrants further
evaluation. In the right breast, no findings suspicious for
malignancy.
IMPRESSION: Further evaluation is suggested for possible asymmetry in the left
breast.

RECOMMENDATION:
Diagnostic mammogram and possibly ultrasound of the left breast.
(Code:J3-6-CCO)

The patient will be contacted regarding the findings, and additional
imaging will be scheduled.

BI-RADS CATEGORY  0: Incomplete. Need additional imaging evaluation
and/or prior mammograms for comparison.

ADDENDUM:
Prior mammograms dated 03/29/2017, 06/30/2015, 05/26/2015 and
04/30/2013 have become available for comparison. When compared to
these prior exams, the mammographic appearance of bilateral breasts
is stable in appearance. There is no mammographic evidence of
malignancy.

Recommendation: Screening mammogram in one year.(Code:W0-I-H1W)

BI-RADS category: 1: Negative.

*** End of Addendum ***
ACR Breast Density Category c: The breast tissue is heterogeneously
dense, which may obscure small masses.
FINDINGS: In the left breast, a possible asymmetry warrants further
evaluation. In the right breast, no findings suspicious for
malignancy.
IMPRESSION: Further evaluation is suggested for possible asymmetry in the left
breast.

RECOMMENDATION:
Diagnostic mammogram and possibly ultrasound of the left breast.
(Code:J3-6-CCO)

The patient will be contacted regarding the findings, and additional
imaging will be scheduled.

BI-RADS CATEGORY  0: Incomplete. Need additional imaging evaluation
and/or prior mammograms for comparison.

## 2023-09-17 ENCOUNTER — Other Ambulatory Visit: Payer: Self-pay

## 2023-09-17 ENCOUNTER — Inpatient Hospital Stay (HOSPITAL_COMMUNITY)
Admission: EM | Admit: 2023-09-17 | Discharge: 2023-09-28 | DRG: 326 | Disposition: A | Discharge status: Home / Self Care

## 2023-09-17 DIAGNOSIS — Z91048 Other nonmedicinal substance allergy status: Secondary | ICD-10-CM

## 2023-09-17 DIAGNOSIS — I2510 Atherosclerotic heart disease of native coronary artery without angina pectoris: Secondary | ICD-10-CM | POA: Diagnosis present

## 2023-09-17 DIAGNOSIS — Z23 Encounter for immunization: Secondary | ICD-10-CM

## 2023-09-17 DIAGNOSIS — E785 Hyperlipidemia, unspecified: Secondary | ICD-10-CM | POA: Diagnosis present

## 2023-09-17 DIAGNOSIS — Z79899 Other long term (current) drug therapy: Secondary | ICD-10-CM

## 2023-09-17 DIAGNOSIS — K449 Diaphragmatic hernia without obstruction or gangrene: Secondary | ICD-10-CM

## 2023-09-17 DIAGNOSIS — Z880 Allergy status to penicillin: Secondary | ICD-10-CM

## 2023-09-17 DIAGNOSIS — E876 Hypokalemia: Secondary | ICD-10-CM | POA: Diagnosis present

## 2023-09-17 DIAGNOSIS — Z91013 Allergy to seafood: Secondary | ICD-10-CM

## 2023-09-17 DIAGNOSIS — K440 Diaphragmatic hernia with obstruction, without gangrene: Secondary | ICD-10-CM | POA: Diagnosis not present

## 2023-09-17 DIAGNOSIS — I252 Old myocardial infarction: Secondary | ICD-10-CM

## 2023-09-17 DIAGNOSIS — J45909 Unspecified asthma, uncomplicated: Secondary | ICD-10-CM | POA: Diagnosis present

## 2023-09-17 DIAGNOSIS — J9811 Atelectasis: Secondary | ICD-10-CM | POA: Diagnosis present

## 2023-09-17 DIAGNOSIS — R112 Nausea with vomiting, unspecified: Secondary | ICD-10-CM | POA: Diagnosis present

## 2023-09-17 DIAGNOSIS — E079 Disorder of thyroid, unspecified: Secondary | ICD-10-CM | POA: Diagnosis present

## 2023-09-17 DIAGNOSIS — K562 Volvulus: Secondary | ICD-10-CM | POA: Diagnosis present

## 2023-09-17 DIAGNOSIS — R7303 Prediabetes: Secondary | ICD-10-CM | POA: Diagnosis present

## 2023-09-17 DIAGNOSIS — I480 Paroxysmal atrial fibrillation: Secondary | ICD-10-CM | POA: Diagnosis present

## 2023-09-17 DIAGNOSIS — Z833 Family history of diabetes mellitus: Secondary | ICD-10-CM

## 2023-09-17 DIAGNOSIS — Z832 Family history of diseases of the blood and blood-forming organs and certain disorders involving the immune mechanism: Secondary | ICD-10-CM

## 2023-09-17 DIAGNOSIS — K311 Adult hypertrophic pyloric stenosis: Principal | ICD-10-CM | POA: Diagnosis present

## 2023-09-17 DIAGNOSIS — Z888 Allergy status to other drugs, medicaments and biological substances status: Secondary | ICD-10-CM

## 2023-09-17 DIAGNOSIS — Z91040 Latex allergy status: Secondary | ICD-10-CM

## 2023-09-17 DIAGNOSIS — Z800 Family history of malignant neoplasm of digestive organs: Secondary | ICD-10-CM

## 2023-09-17 DIAGNOSIS — Z91010 Allergy to peanuts: Secondary | ICD-10-CM

## 2023-09-17 DIAGNOSIS — D509 Iron deficiency anemia, unspecified: Secondary | ICD-10-CM | POA: Diagnosis present

## 2023-09-17 DIAGNOSIS — I4891 Unspecified atrial fibrillation: Secondary | ICD-10-CM | POA: Diagnosis present

## 2023-09-17 DIAGNOSIS — K660 Peritoneal adhesions (postprocedural) (postinfection): Secondary | ICD-10-CM | POA: Diagnosis present

## 2023-09-17 DIAGNOSIS — Z860100 Personal history of colon polyps, unspecified: Secondary | ICD-10-CM

## 2023-09-17 NOTE — ED Triage Notes (Signed)
 BIB [facility] from home. Pt reports extensive GI history and has nausea/vomiting at baseline. Pt reports increased nausea vomiting today and non traumatic L shoulder pain. 4 mg Zofran  given en route.

## 2023-09-18 ENCOUNTER — Inpatient Hospital Stay (HOSPITAL_COMMUNITY)

## 2023-09-18 ENCOUNTER — Emergency Department (HOSPITAL_COMMUNITY)

## 2023-09-18 ENCOUNTER — Encounter (HOSPITAL_COMMUNITY): Payer: Self-pay | Admitting: Internal Medicine

## 2023-09-18 DIAGNOSIS — I4891 Unspecified atrial fibrillation: Secondary | ICD-10-CM | POA: Diagnosis not present

## 2023-09-18 DIAGNOSIS — Z91040 Latex allergy status: Secondary | ICD-10-CM | POA: Diagnosis not present

## 2023-09-18 DIAGNOSIS — K449 Diaphragmatic hernia without obstruction or gangrene: Secondary | ICD-10-CM | POA: Diagnosis present

## 2023-09-18 DIAGNOSIS — D509 Iron deficiency anemia, unspecified: Secondary | ICD-10-CM | POA: Diagnosis present

## 2023-09-18 DIAGNOSIS — K660 Peritoneal adhesions (postprocedural) (postinfection): Secondary | ICD-10-CM | POA: Diagnosis present

## 2023-09-18 DIAGNOSIS — E785 Hyperlipidemia, unspecified: Secondary | ICD-10-CM | POA: Diagnosis present

## 2023-09-18 DIAGNOSIS — Z91010 Allergy to peanuts: Secondary | ICD-10-CM | POA: Diagnosis not present

## 2023-09-18 DIAGNOSIS — Z23 Encounter for immunization: Secondary | ICD-10-CM | POA: Diagnosis not present

## 2023-09-18 DIAGNOSIS — K311 Adult hypertrophic pyloric stenosis: Secondary | ICD-10-CM | POA: Diagnosis present

## 2023-09-18 DIAGNOSIS — J45909 Unspecified asthma, uncomplicated: Secondary | ICD-10-CM | POA: Diagnosis present

## 2023-09-18 DIAGNOSIS — E876 Hypokalemia: Secondary | ICD-10-CM | POA: Diagnosis present

## 2023-09-18 DIAGNOSIS — Z888 Allergy status to other drugs, medicaments and biological substances status: Secondary | ICD-10-CM | POA: Diagnosis not present

## 2023-09-18 DIAGNOSIS — R7303 Prediabetes: Secondary | ICD-10-CM | POA: Diagnosis present

## 2023-09-18 DIAGNOSIS — E079 Disorder of thyroid, unspecified: Secondary | ICD-10-CM | POA: Diagnosis present

## 2023-09-18 DIAGNOSIS — I252 Old myocardial infarction: Secondary | ICD-10-CM | POA: Diagnosis not present

## 2023-09-18 DIAGNOSIS — Z91048 Other nonmedicinal substance allergy status: Secondary | ICD-10-CM | POA: Diagnosis not present

## 2023-09-18 DIAGNOSIS — Z880 Allergy status to penicillin: Secondary | ICD-10-CM | POA: Diagnosis not present

## 2023-09-18 DIAGNOSIS — R112 Nausea with vomiting, unspecified: Secondary | ICD-10-CM | POA: Diagnosis present

## 2023-09-18 DIAGNOSIS — J9811 Atelectasis: Secondary | ICD-10-CM | POA: Diagnosis present

## 2023-09-18 DIAGNOSIS — K440 Diaphragmatic hernia with obstruction, without gangrene: Secondary | ICD-10-CM | POA: Diagnosis present

## 2023-09-18 DIAGNOSIS — I739 Peripheral vascular disease, unspecified: Secondary | ICD-10-CM | POA: Diagnosis not present

## 2023-09-18 DIAGNOSIS — I2510 Atherosclerotic heart disease of native coronary artery without angina pectoris: Secondary | ICD-10-CM | POA: Diagnosis present

## 2023-09-18 DIAGNOSIS — I480 Paroxysmal atrial fibrillation: Secondary | ICD-10-CM | POA: Diagnosis present

## 2023-09-18 DIAGNOSIS — Z833 Family history of diabetes mellitus: Secondary | ICD-10-CM | POA: Diagnosis not present

## 2023-09-18 DIAGNOSIS — Z91013 Allergy to seafood: Secondary | ICD-10-CM | POA: Diagnosis not present

## 2023-09-18 DIAGNOSIS — K562 Volvulus: Secondary | ICD-10-CM | POA: Diagnosis present

## 2023-09-18 DIAGNOSIS — Z79899 Other long term (current) drug therapy: Secondary | ICD-10-CM | POA: Diagnosis not present

## 2023-09-18 LAB — ECHOCARDIOGRAM COMPLETE
Area-P 1/2: 2.92 cm2
Height: 63 in
S' Lateral: 2.6 cm
Weight: 2240 [oz_av]

## 2023-09-18 LAB — COMPREHENSIVE METABOLIC PANEL WITH GFR
ALT: 15 U/L (ref 0–44)
AST: 19 U/L (ref 15–41)
Albumin: 3.7 g/dL (ref 3.5–5.0)
Alkaline Phosphatase: 69 U/L (ref 38–126)
Anion gap: 13 (ref 5–15)
BUN: 15 mg/dL (ref 8–23)
CO2: 24 mmol/L (ref 22–32)
Calcium: 8.8 mg/dL — ABNORMAL LOW (ref 8.9–10.3)
Chloride: 103 mmol/L (ref 98–111)
Creatinine, Ser: 0.96 mg/dL (ref 0.44–1.00)
GFR, Estimated: >60 mL/min (ref >60)
Glucose, Bld: 150 mg/dL — ABNORMAL HIGH (ref 70–99)
Potassium: 3.3 mmol/L — ABNORMAL LOW (ref 3.5–5.1)
Sodium: 140 mmol/L (ref 135–145)
Total Bilirubin: 0.7 mg/dL (ref 0.0–1.2)
Total Protein: 7.5 g/dL (ref 6.5–8.1)

## 2023-09-18 LAB — CBC
HCT: 39.3 % (ref 36.0–46.0)
Hemoglobin: 13 g/dL (ref 12.0–15.0)
MCH: 30.1 pg (ref 26.0–34.0)
MCHC: 33.1 g/dL (ref 30.0–36.0)
MCV: 91 fL (ref 80.0–100.0)
Platelets: 295 K/uL (ref 150–400)
RBC: 4.32 MIL/uL (ref 3.87–5.11)
RDW: 13.2 % (ref 11.5–15.5)
WBC: 9.8 K/uL (ref 4.0–10.5)
nRBC: 0 % (ref 0.0–0.2)

## 2023-09-18 LAB — TROPONIN I (HIGH SENSITIVITY)
Troponin I (High Sensitivity): 4 ng/L (ref <18)
Troponin I (High Sensitivity): 6 ng/L (ref <18)

## 2023-09-18 LAB — LIPASE, BLOOD: Lipase: 35 U/L (ref 11–51)

## 2023-09-18 LAB — TSH: TSH: 0.63 u[IU]/mL (ref 0.350–4.500)

## 2023-09-18 LAB — MAGNESIUM: Magnesium: 1.9 mg/dL (ref 1.7–2.4)

## 2023-09-18 MED ORDER — SODIUM CHLORIDE 0.9% FLUSH
3.0000 mL | Freq: Two times a day (BID) | INTRAVENOUS | Status: DC
  Administered 2023-09-18 – 2023-09-28 (×17): 3 mL via INTRAVENOUS

## 2023-09-18 MED ORDER — METOPROLOL TARTRATE 5 MG/5ML IV SOLN
5.0000 mg | Freq: Once | INTRAVENOUS | Status: AC
  Administered 2023-09-18: 5 mg via INTRAVENOUS
  Filled 2023-09-18: qty 5

## 2023-09-18 MED ORDER — PNEUMOCOCCAL 20-VAL CONJ VACC 0.5 ML IM SUSY
0.5000 mL | PREFILLED_SYRINGE | INTRAMUSCULAR | Status: AC
  Administered 2023-09-19: 0.5 mL via INTRAMUSCULAR
  Filled 2023-09-18: qty 0.5

## 2023-09-18 MED ORDER — LACTATED RINGERS IV SOLN: INTRAVENOUS | Status: DC

## 2023-09-18 MED ORDER — CALCIUM GLUCONATE-NACL 1-0.675 GM/50ML-% IV SOLN
1.0000 g | Freq: Once | INTRAVENOUS | Status: AC
  Administered 2023-09-18: 1000 mg via INTRAVENOUS
  Filled 2023-09-18: qty 50

## 2023-09-18 MED ORDER — PANTOPRAZOLE SODIUM 40 MG IV SOLR
40.0000 mg | Freq: Two times a day (BID) | INTRAVENOUS | Status: DC
  Administered 2023-09-18 – 2023-09-26 (×18): 40 mg via INTRAVENOUS
  Filled 2023-09-18 (×18): qty 10

## 2023-09-18 MED ORDER — ACETAMINOPHEN 650 MG RE SUPP
650.0000 mg | Freq: Four times a day (QID) | RECTAL | Status: DC | PRN
Start: 2023-09-18 — End: 2023-09-25
  Administered 2023-09-23: 650 mg via RECTAL
  Filled 2023-09-18: qty 1

## 2023-09-18 MED ORDER — ACETAMINOPHEN 325 MG PO TABS
650.0000 mg | ORAL_TABLET | Freq: Four times a day (QID) | ORAL | Status: DC | PRN
  Administered 2023-09-19 – 2023-09-25 (×3): 650 mg
  Filled 2023-09-18 (×3): qty 2

## 2023-09-18 MED ORDER — HALOPERIDOL LACTATE 5 MG/ML IJ SOLN
2.0000 mg | Freq: Once | INTRAMUSCULAR | Status: AC
  Administered 2023-09-18: 2 mg via INTRAVENOUS
  Filled 2023-09-18: qty 1

## 2023-09-18 MED ORDER — ONDANSETRON HCL 4 MG/2ML IJ SOLN
4.0000 mg | Freq: Four times a day (QID) | INTRAMUSCULAR | Status: DC | PRN
  Administered 2023-09-18: 4 mg via INTRAVENOUS
  Filled 2023-09-18: qty 2

## 2023-09-18 MED ORDER — ENOXAPARIN SODIUM 40 MG/0.4ML IJ SOSY
40.0000 mg | PREFILLED_SYRINGE | INTRAMUSCULAR | Status: DC
  Administered 2023-09-18 – 2023-09-28 (×10): 40 mg via SUBCUTANEOUS
  Filled 2023-09-18 (×10): qty 0.4

## 2023-09-18 MED ORDER — MORPHINE SULFATE (PF) 2 MG/ML IV SOLN
2.0000 mg | INTRAVENOUS | Status: DC | PRN
  Administered 2023-09-19: 2 mg via INTRAVENOUS
  Filled 2023-09-18: qty 1

## 2023-09-18 MED ORDER — ONDANSETRON HCL 4 MG PO TABS: 4.0000 mg | ORAL_TABLET | Freq: Four times a day (QID) | ORAL | Status: DC | PRN

## 2023-09-18 MED ORDER — IOHEXOL 350 MG/ML SOLN
75.0000 mL | Freq: Once | INTRAVENOUS | Status: AC | PRN
  Administered 2023-09-18: 75 mL via INTRAVENOUS

## 2023-09-18 MED ORDER — POTASSIUM CHLORIDE 10 MEQ/100ML IV SOLN
10.0000 meq | INTRAVENOUS | Status: AC
  Administered 2023-09-18 (×4): 10 meq via INTRAVENOUS
  Filled 2023-09-18 (×4): qty 100

## 2023-09-18 MED ORDER — ALBUTEROL SULFATE (2.5 MG/3ML) 0.083% IN NEBU: 2.5000 mg | INHALATION_SOLUTION | Freq: Four times a day (QID) | RESPIRATORY_TRACT | Status: DC | PRN

## 2023-09-18 MED ORDER — INFLUENZA VAC SPLIT HIGH-DOSE 0.5 ML IM SUSY
0.5000 mL | PREFILLED_SYRINGE | INTRAMUSCULAR | Status: AC
  Administered 2023-09-19: 0.5 mL via INTRAMUSCULAR
  Filled 2023-09-18: qty 0.5

## 2023-09-18 MED ORDER — ONDANSETRON HCL 4 MG/2ML IJ SOLN
4.0000 mg | Freq: Once | INTRAMUSCULAR | Status: AC
  Administered 2023-09-18: 4 mg via INTRAVENOUS
  Filled 2023-09-18: qty 2

## 2023-09-18 MED ORDER — PANTOPRAZOLE SODIUM 40 MG IV SOLR
40.0000 mg | Freq: Once | INTRAVENOUS | Status: AC
  Administered 2023-09-18: 40 mg via INTRAVENOUS
  Filled 2023-09-18: qty 10

## 2023-09-18 MED ORDER — MORPHINE SULFATE (PF) 4 MG/ML IV SOLN
4.0000 mg | Freq: Once | INTRAVENOUS | Status: AC
  Administered 2023-09-18: 4 mg via INTRAVENOUS
  Filled 2023-09-18: qty 1

## 2023-09-18 MED ORDER — SODIUM CHLORIDE 0.9 % IV BOLUS
1000.0000 mL | Freq: Once | INTRAVENOUS | Status: AC
  Administered 2023-09-18: 1000 mL via INTRAVENOUS

## 2023-09-18 NOTE — Progress Notes (Signed)
 New Admission Note:   Arrival Method: stretcher Mental Orientation: aa+ox4 Telemetry: SR Assessment: Completed Skin: c/d/i IV: LR at 100 Pain: denies Tubes: NGT to LIS Safety Measures: Safety Fall Prevention Plan has been given, discussed and signed Admission: Completed [facility] Orientation: Patient has been orientated to the room, unit and staff.  Family:  Orders have been reviewed and implemented. Will continue to monitor the patient. Call light has been placed within reach and bed alarm has been activated.   Doyal Sias, RN

## 2023-09-18 NOTE — Progress Notes (Signed)
 [facility] night cross cover note:   I was contacted by the patient's RN with request for plain film to evaluate positioning of newly placed NG tube. I subsequently ordered plain film for this purpose.      Eva Pore, DO Hospitalist

## 2023-09-18 NOTE — Plan of Care (Signed)

## 2023-09-18 NOTE — ED Provider Notes (Signed)
 [facility] Emergency Department Provider Note MRN:  [id number]  Arrival date & time: 09/18/23     Chief Complaint   Nausea   History of Present Illness   Shelly Hammond is a 73 y.o. year-old female with a history of CAD presenting to the ED with chief complaint of nausea.  Left shoulder and arm pain associated with nausea and vomiting.  Review of Systems  A thorough review of systems was obtained and all systems are negative except as noted in the HPI and PMH.   Patient's Health History    Past Medical History:  Diagnosis Date   Anemia    Asthma    Benign colon polyp    Heart attack (HCC)    Hyperlipidemia    Prediabetes     Past Surgical History:  Procedure Laterality Date   LYMPH NODE BIOPSY     neck    Family History  Problem Relation Age of Onset   Diabetes Mother    Colon cancer Father    Aneurysm Brother     Social History   Socioeconomic History   Marital status: Single    Spouse name: Not on file   Number of children: Not on file   Years of education: Not on file   Highest education level: Not on file  Occupational History   Not on file  Tobacco Use   Smoking status: Never   Smokeless tobacco: Never  Vaping Use   Vaping status: Never Used  Substance and Sexual Activity   Alcohol use: No   Drug use: No   Sexual activity: Not Currently    Birth control/protection: None  Other Topics Concern   Not on file  Social History Narrative   Not on file   Social Drivers of Health   Financial Resource Strain: Not on file  Food Insecurity: Not on file  Transportation Needs: Not on file  Physical Activity: Not on file  Stress: Not on file  Social Connections: Unknown (05/13/2021)   Received from [facility]   Social Network    Social Network: Not on file  Intimate Partner Violence: Unknown (04/22/2021)   Received from [facility]   HITS    Physically Hurt: Not on file    Insult or Talk Down To: Not on file    Threaten  Physical Harm: Not on file    Scream or Curse: Not on file     Physical Exam   Vitals:   09/18/23 0400 09/18/23 0536  BP: 139/70   Pulse: 64   Resp:    Temp:  98.2 F (36.8 C)  SpO2: 98%     CONSTITUTIONAL: Ill-appearing, actively vomiting NEURO/PSYCH:  Alert and oriented x 3, no focal deficits EYES:  eyes equal and reactive ENT/NECK:  no LAD, no JVD CARDIO: Tachycardic rate, well-perfused, normal S1 and S2 PULM:  CTAB no wheezing or rhonchi GI/GU:  non-distended, non-tender MSK/SPINE:  No gross deformities, no edema SKIN:  no rash, atraumatic   *Additional and/or pertinent findings included in MDM below  Diagnostic and Interventional Summary    EKG Interpretation Date/Time:  Monday September 18 2023 01:22:08 EDT Ventricular Rate:  183 PR Interval:  173 QRS Duration:  83 QT Interval:  270 QTC Calculation: 472 R Axis:   78  Text Interpretation: Atrial fibrillation with rapid V-rate Repolarization abnormality, prob rate related When compared with ECG of 09/17/2023, Atrial fibrillation with rapid ventricular response has replaced Sinus rhythm REPOLARIZATION ABNORMALITY is now present -  likely rate-related Confirmed by Raford Lenis ([id number]) on 09/18/2023 4:52:36 AM       Labs Reviewed  COMPREHENSIVE METABOLIC PANEL WITH GFR - Abnormal; Notable for the following components:      Result Value   Potassium 3.3 (*)    Glucose, Bld 150 (*)    Calcium  8.8 (*)    All other components within normal limits  CBC  LIPASE, BLOOD  TROPONIN I (HIGH SENSITIVITY)  TROPONIN I (HIGH SENSITIVITY)    DG Abd Portable 1 View  Final Result    CT Angio Chest Pulmonary Embolism (PE) W or WO Contrast  Final Result    CT ABDOMEN PELVIS W CONTRAST  Final Result    DG Chest Port 1 View  Final Result      Medications  lactated ringers  infusion ( Intravenous New Bag/Given 09/18/23 0532)  potassium chloride  10 mEq in 100 mL IVPB (10 mEq Intravenous New Bag/Given 09/18/23 0533)  ondansetron   (ZOFRAN ) injection 4 mg (4 mg Intravenous Given 09/18/23 0019)  morphine  (PF) 4 MG/ML injection 4 mg (4 mg Intravenous Given 09/18/23 0019)  sodium chloride  0.9 % bolus 1,000 mL (0 mLs Intravenous Stopped 09/18/23 0325)  metoprolol  tartrate (LOPRESSOR ) injection 5 mg (5 mg Intravenous Given 09/18/23 0132)  ondansetron  (ZOFRAN ) injection 4 mg (4 mg Intravenous Given 09/18/23 0131)  haloperidol  lactate (HALDOL ) injection 2 mg (2 mg Intravenous Given 09/18/23 0159)  pantoprazole  (PROTONIX ) injection 40 mg (40 mg Intravenous Given 09/18/23 0245)  iohexol  (OMNIPAQUE ) 350 MG/ML injection 75 mL (75 mLs Intravenous Contrast Given 09/18/23 0248)     Procedures  /  Critical Care .Critical Care  Performed by: Theadore Ozell HERO, MD Authorized by: Theadore Ozell HERO, MD   Critical care provider statement:    Critical care time (minutes):  35   Critical care was necessary to treat or prevent imminent or life-threatening deterioration of the following conditions: Gastric outlet obstruction.   Critical care was time spent personally by me on the following activities:  Development of treatment plan with patient or surrogate, discussions with consultants, evaluation of patient's response to treatment, examination of patient, ordering and review of laboratory studies, ordering and review of radiographic studies, ordering and performing treatments and interventions, pulse oximetry, re-evaluation of patient's condition and review of old charts   ED Course and Medical Decision Making  Initial Impression and Ddx Concern for atypical presentation of ACS given patient's history of CAD.  Initial EKG with nonspecific findings.  Patient had a brief episode of A-fib with RVR during an episode of vomiting, went back into sinus rhythm.  Awaiting labs  Past medical/surgical history that increases complexity of ED encounter: CAD  Interpretation of Diagnostics I personally reviewed the EKG and my interpretation is as follows: Sinus  rhythm  Patient had subsequent EKGs that demonstrated A-fib with RVR during episodes of retching.  Patient Reassessment and Ultimate Disposition/Management Clinical Course as of 09/18/23 9362  [facility] Sep 18, 2023  0220 Continued nausea vomiting of unclear etiology difficult to control with antiemetics.  Proceeding with CT imaging to evaluate for gastric outlet obstruction, also evaluating for PE given the new onset A-fib. [MB]    Clinical Course User Index [MB] Theadore Ozell HERO, MD     CT imaging showing evidence of at least a partial gastric outlet obstruction related to patient's hiatal hernia.  Case discussed with Dr. Sebastian of general surgery.  Placing NG tube, will admit to medicine.  Patient management required discussion with the following services or  consulting groups:  Hospitalist Service and General/Trauma Surgery  Complexity of Problems Addressed Acute illness or injury that poses threat of life of bodily function  Additional Data Reviewed and Analyzed Further history obtained from: Prior labs/imaging results  Additional Factors Impacting ED Encounter Risk Consideration of hospitalization  Ozell HERO. Theadore, MD [facility] Emergency Medicine [facility] [email or web address]  Final Clinical Impressions(s) / ED Diagnoses     ICD-10-CM   1. Gastric outlet obstruction  K31.1     2. Hiatal hernia  K44.9     3. Nausea and vomiting, unspecified vomiting type  R11.2       ED Discharge Orders     None        Discharge Instructions Discussed with and Provided to Patient:   Discharge Instructions   None      Theadore Ozell HERO, MD 09/18/23 [phone]

## 2023-09-18 NOTE — TOC CM/SW Note (Signed)
 Transition of Care [facility]) - Inpatient Brief Assessment   Patient Details  Name: Shelly Hammond MRN: [id number] Date of Birth: 1950-01-28  Transition of Care [facility]) CM/SW Contact:    Tom-Johnson, Harvest Muskrat, RN Phone Number: 09/18/2023, 4:17 PM   Clinical Narrative:  Patient presented to the ED with worsening N/V and lt Shoulder pain. CT Chest/Abdomen showed    increased nausea vomiting today and non traumatic L shoulder pain  CT imaging showing evidence of at least a partial gastric outlet obstruction related to patient's hiatal hernia. Case discussed with Dr. Sebastian of general surgery. Placing NG tube,  CT scan of the chest abdomen pelvis demonstrates a large hiatal hernia with two thirds of the stomach within the chest causing some gastric obstruction. I was asked to see her for surgical management.      Transition of Care Asessment:

## 2023-09-18 NOTE — Progress Notes (Signed)
  Echocardiogram 2D Echocardiogram has been performed.  Tinnie FORBES Gosling RDCS 09/18/2023, 10:00 AM

## 2023-09-18 NOTE — ED Notes (Signed)
 x2 doses of Zofran  ineffective at controlling nausea. Provider notified. Order for Haldol  received.

## 2023-09-18 NOTE — Consult Note (Signed)
 Reason for Consult:HH with GOO Referring Physician: Wreatha Hammond is an 73 y.o. female.  HPI: 73yo F with PMHx as below presented to the ED with N/V.  She has a hiatal hernia diagnosed in June 2024 when she underwent upper and lower endoscopy by Dr. Stacia.  She reports she has to watch what she eats.  She will frequently get some nausea and vomiting which spontaneously resolves.  This time was worse.  She is not having any abdominal pain.  CT scan of the chest abdomen pelvis demonstrates a large hiatal hernia with two thirds of the stomach within the chest causing some gastric obstruction.  I was asked to see her for surgical management.  Past Medical History:  Diagnosis Date   Anemia    Asthma    Benign colon polyp    Heart attack (HCC)    Hyperlipidemia    Prediabetes     Past Surgical History:  Procedure Laterality Date   LYMPH NODE BIOPSY     neck    Family History  Problem Relation Age of Onset   Diabetes Mother    Colon cancer Father    Aneurysm Brother     Social History:  reports that she has never smoked. She has never used smokeless tobacco. She reports that she does not drink alcohol and does not use drugs.  Allergies:  Allergies  Allergen Reactions   Penicillins Rash and Swelling   Dust Mite Extract     Other reaction(s): Unknown sneezing   Methyldopa Other (See Comments)    vertigo   Peanut Oil     Other reaction(s): Other (See Comments) Avoid Peanuts, unknown reaction.   Shellfish Allergy     Medications: I have reviewed the patient's current medications.  Results for orders placed or performed during the hospital encounter of 09/17/23 (from the past 48 hours)  CBC     Status: None   Collection Time: 09/18/23 12:20 AM  Result Value Ref Range   WBC 9.8 4.0 - 10.5 K/uL   RBC 4.32 3.87 - 5.11 MIL/uL   Hemoglobin 13.0 12.0 - 15.0 g/dL   HCT 60.6 63.9 - 53.9 %   MCV 91.0 80.0 - 100.0 fL   MCH 30.1 26.0 - 34.0 pg   MCHC 33.1 30.0 -  36.0 g/dL   RDW 86.7 88.4 - 84.4 %   Platelets 295 150 - 400 K/uL   nRBC 0.0 0.0 - 0.2 %    Comment: Performed at [facility], [address], [place], [place] [address]  Troponin I (High Sensitivity)     Status: None   Collection Time: 09/18/23 12:20 AM  Result Value Ref Range   Troponin I (High Sensitivity) 4 <18 ng/L    Comment: (NOTE) Elevated high sensitivity troponin I (hsTnI) values and significant  changes across serial measurements may suggest ACS but many other  chronic and acute conditions are known to elevate hsTnI results.  Refer to the Links section for chest pain algorithms and additional  guidance. Performed at [facility], [address] [place], [place] [address]   Comprehensive metabolic panel with GFR     Status: Abnormal   Collection Time: 09/18/23 12:20 AM  Result Value Ref Range   Sodium 140 135 - 145 mmol/L   Potassium 3.3 (L) 3.5 - 5.1 mmol/L   Chloride 103 98 - 111 mmol/L   CO2 24 22 - 32 mmol/L   Glucose, Bld 150 (H) 70 -  99 mg/dL    Comment: Glucose reference range applies only to samples taken after fasting for at least 8 hours.   BUN 15 8 - 23 mg/dL   Creatinine, Ser 9.03 0.44 - 1.00 mg/dL   Calcium  8.8 (L) 8.9 - 10.3 mg/dL   Total Protein 7.5 6.5 - 8.1 g/dL   Albumin 3.7 3.5 - 5.0 g/dL   AST 19 15 - 41 U/L   ALT 15 0 - 44 U/L   Alkaline Phosphatase 69 38 - 126 U/L   Total Bilirubin 0.7 0.0 - 1.2 mg/dL   GFR, Estimated >39 >39 mL/min    Comment: (NOTE) Calculated using the CKD-EPI Creatinine Equation (2021)    Anion gap 13 5 - 15    Comment: Performed at [facility], [address], [place], [place] [address]  Troponin I (High Sensitivity)     Status: None   Collection Time: 09/18/23  3:11 AM  Result Value Ref Range   Troponin I (High Sensitivity) 6 <18 ng/L    Comment: (NOTE) Elevated high sensitivity troponin I (hsTnI) values and significant  changes across serial measurements may suggest ACS but many other   chronic and acute conditions are known to elevate hsTnI results.  Refer to the Links section for chest pain algorithms and additional  guidance. Performed at [facility], [address] [place], [place] [address]     CT Angio Chest Pulmonary Embolism (PE) W or WO Contrast Result Date: 09/18/2023 CLINICAL DATA:  Chest and abdomen pain. Bowel obstruction suspected. EXAM: CT ANGIOGRAPHY CHEST CT ABDOMEN AND PELVIS WITH CONTRAST TECHNIQUE: Multidetector CT imaging of the chest was performed using the standard protocol during bolus administration of intravenous contrast. Multiplanar CT image reconstructions and MIPs were obtained to evaluate the vascular anatomy. Multidetector CT imaging of the abdomen and pelvis was performed using the standard protocol during bolus administration of intravenous contrast. RADIATION DOSE REDUCTION: This exam was performed according to the departmental dose-optimization program which includes automated exposure control, adjustment of the mA and/or kV according to patient size and/or use of iterative reconstruction technique. CONTRAST:  75mL OMNIPAQUE  IOHEXOL  350 MG/ML SOLN COMPARISON:  Portable chest today is the only relevant prior. FINDINGS: CTA CHEST FINDINGS Cardiovascular: There is mild cardiomegaly. The dorsal walls of the left atrium and ventricle are mildly effaced due to a large hiatal hernia. There is no pericardial effusion no visible coronary calcification. Pulmonary arteries are normal caliber without reference are. There is tortuosity and scattered calcific plaques of the aorta. Minimal calcification in the great vessels. No aneurysm, stenosis or dissection. There is no venous dilatation. Mediastinum/Nodes: Large hiatal hernia with upper 2/3 of the femoral intrathoracic with the stomach dilated with fluid, scattered fluid in the thoracic esophagus. This places the patient at risk for aspiration. Aspiration precautions recommended unless already being  done. There is a heterogeneous mass in the left thyroid lobe displacing the trachea to the right. It is not well seen due to metal artifact from an overlying necklace, but it measures at least 3.6 cm. Nonemergent follow-up ultrasound is recommended. The right lobe, both axillary spaces are unremarkable. No intrathoracic adenopathy is seen. Negative thoracic trachea. No esophageal wall thickening. Lungs/Pleura: There is compressive atelectasis in the lower lobes alongside the large hiatal hernia There are additional scattered linear scar-like opacities in the lung bases and mild posterior and lateral basal atelectasis. No infiltrate, nodule or effusion are seen. No pneumothorax. Limited fine detail due to respiratory motion. Musculoskeletal:  There is kyphosis and degenerative change of the thoracic spine. No spinal compression fracture. No acute or other significant osseous findings. Unremarkable chest wall. Review of the MIP images confirms the above findings. CT ABDOMEN and PELVIS FINDINGS Hepatobiliary: There is a calcified subcapsular granuloma inferiorly in segment 6. Remainder of the liver is unremarkable. Gallbladder is absent without biliary dilatation. Pancreas: No abnormality. Spleen: No abnormality.  No splenomegaly. Adrenals/Urinary Tract: There is no adrenal mass. There is a 3.3 cm Bosniak 1 cyst in the inferior pole of the right kidney, Hounsfield density 11. No follow-up imaging is recommended. The remainder of both kidneys enhance uniformly without mass enhancement bilaterally. There is no urinary stone or obstruction. There is no bladder thickening. Stomach/Bowel: As above, the upper 2/3 of the stomach is intrathoracic due to a large hiatal hernia, the stomach dilated with fluid both above and below the hernia mouth. There is an abrupt caliber change at the junction the collapsed antrum and dilated body of the stomach, which is just below the anterior lip of the diaphragmatic hernia and could be  being compressed by the diaphragm. In any case there does appear to be impaired gastric emptying through this area. For reference, refer to coronal series 6 image 67, and sagittal series 7 images 56-60. The unopacified small bowel is normal caliber without inflammatory changes or overt wall thickening. The appendix is normal, retrocecal location. The large bowel is contracted for the most part. There is advanced sigmoid diverticulosis but no evidence of acute diverticulitis. Vascular/Lymphatic: Aortic atherosclerosis. No enlarged abdominal or pelvic lymph nodes. Reproductive: 1.7 cm calcified fibroid noted over the right lateral body of uterus. The uterus is not enlarged nor otherwise unremarkable. The ovaries are normal in size. Other: No abdominal wall hernia, and no free fluid, free hemorrhage or free air are seen. Musculoskeletal: No acute or significant regional osseous findings. Review of the MIP images confirms the above findings. IMPRESSION: 1. No evidence of pulmonary arterial dilatation or embolus. 2. Aortic atherosclerosis. 3. Large hiatal hernia with upper 2/3 of the stomach intrathoracic, with the stomach dilated with fluid, and with an abrupt caliber change at the junction of the collapsed antrum and dilated body of the stomach. This junction is just below the anterior lip of the diaphragmatic hernia and could be being compressed by the diaphragm. In any case there does appear to be impaired gastric emptying through this area. 4. Fluid in the thoracic esophagus. Aspiration precautions recommended unless already being done. 5. 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 Nonemergent follow-up ultrasound is recommended. 6. 3.3 cm Bosniak 1 cyst inferior pole right kidney. No follow-up imaging recommended. 7. Advanced sigmoid diverticulosis without evidence of diverticulitis. 8. 1.7 cm calcified uterine fibroid. Aortic Atherosclerosis (ICD10-I70.0). Electronically Signed   By: Francis Quam M.D.   On: 09/18/2023 03:29    CT ABDOMEN PELVIS W CONTRAST Result Date: 09/18/2023 CLINICAL DATA:  Chest and abdomen pain. Bowel obstruction suspected. EXAM: CT ANGIOGRAPHY CHEST CT ABDOMEN AND PELVIS WITH CONTRAST TECHNIQUE: Multidetector CT imaging of the chest was performed using the standard protocol during bolus administration of intravenous contrast. Multiplanar CT image reconstructions and MIPs were obtained to evaluate the vascular anatomy. Multidetector CT imaging of the abdomen and pelvis was performed using the standard protocol during bolus administration of intravenous contrast. RADIATION DOSE REDUCTION: This exam was performed according to the departmental dose-optimization program which includes automated exposure control, adjustment of the mA and/or kV according to patient size and/or use of iterative reconstruction technique.  CONTRAST:  75mL OMNIPAQUE  IOHEXOL  350 MG/ML SOLN COMPARISON:  Portable chest today is the only relevant prior. FINDINGS: CTA CHEST FINDINGS Cardiovascular: There is mild cardiomegaly. The dorsal walls of the left atrium and ventricle are mildly effaced due to a large hiatal hernia. There is no pericardial effusion no visible coronary calcification. Pulmonary arteries are normal caliber without reference are. There is tortuosity and scattered calcific plaques of the aorta. Minimal calcification in the great vessels. No aneurysm, stenosis or dissection. There is no venous dilatation. Mediastinum/Nodes: Large hiatal hernia with upper 2/3 of the femoral intrathoracic with the stomach dilated with fluid, scattered fluid in the thoracic esophagus. This places the patient at risk for aspiration. Aspiration precautions recommended unless already being done. There is a heterogeneous mass in the left thyroid lobe displacing the trachea to the right. It is not well seen due to metal artifact from an overlying necklace, but it measures at least 3.6 cm. Nonemergent follow-up ultrasound is recommended. The right  lobe, both axillary spaces are unremarkable. No intrathoracic adenopathy is seen. Negative thoracic trachea. No esophageal wall thickening. Lungs/Pleura: There is compressive atelectasis in the lower lobes alongside the large hiatal hernia There are additional scattered linear scar-like opacities in the lung bases and mild posterior and lateral basal atelectasis. No infiltrate, nodule or effusion are seen. No pneumothorax. Limited fine detail due to respiratory motion. Musculoskeletal: There is kyphosis and degenerative change of the thoracic spine. No spinal compression fracture. No acute or other significant osseous findings. Unremarkable chest wall. Review of the MIP images confirms the above findings. CT ABDOMEN and PELVIS FINDINGS Hepatobiliary: There is a calcified subcapsular granuloma inferiorly in segment 6. Remainder of the liver is unremarkable. Gallbladder is absent without biliary dilatation. Pancreas: No abnormality. Spleen: No abnormality.  No splenomegaly. Adrenals/Urinary Tract: There is no adrenal mass. There is a 3.3 cm Bosniak 1 cyst in the inferior pole of the right kidney, Hounsfield density 11. No follow-up imaging is recommended. The remainder of both kidneys enhance uniformly without mass enhancement bilaterally. There is no urinary stone or obstruction. There is no bladder thickening. Stomach/Bowel: As above, the upper 2/3 of the stomach is intrathoracic due to a large hiatal hernia, the stomach dilated with fluid both above and below the hernia mouth. There is an abrupt caliber change at the junction the collapsed antrum and dilated body of the stomach, which is just below the anterior lip of the diaphragmatic hernia and could be being compressed by the diaphragm. In any case there does appear to be impaired gastric emptying through this area. For reference, refer to coronal series 6 image 67, and sagittal series 7 images 56-60. The unopacified small bowel is normal caliber without  inflammatory changes or overt wall thickening. The appendix is normal, retrocecal location. The large bowel is contracted for the most part. There is advanced sigmoid diverticulosis but no evidence of acute diverticulitis. Vascular/Lymphatic: Aortic atherosclerosis. No enlarged abdominal or pelvic lymph nodes. Reproductive: 1.7 cm calcified fibroid noted over the right lateral body of uterus. The uterus is not enlarged nor otherwise unremarkable. The ovaries are normal in size. Other: No abdominal wall hernia, and no free fluid, free hemorrhage or free air are seen. Musculoskeletal: No acute or significant regional osseous findings. Review of the MIP images confirms the above findings. IMPRESSION: 1. No evidence of pulmonary arterial dilatation or embolus. 2. Aortic atherosclerosis. 3. Large hiatal hernia with upper 2/3 of the stomach intrathoracic, with the stomach dilated with fluid, and with an  abrupt caliber change at the junction of the collapsed antrum and dilated body of the stomach. This junction is just below the anterior lip of the diaphragmatic hernia and could be being compressed by the diaphragm. In any case there does appear to be impaired gastric emptying through this area. 4. Fluid in the thoracic esophagus. Aspiration precautions recommended unless already being done. 5. 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 Nonemergent follow-up ultrasound is recommended. 6. 3.3 cm Bosniak 1 cyst inferior pole right kidney. No follow-up imaging recommended. 7. Advanced sigmoid diverticulosis without evidence of diverticulitis. 8. 1.7 cm calcified uterine fibroid. Aortic Atherosclerosis (ICD10-I70.0). Electronically Signed   By: Francis Quam M.D.   On: 09/18/2023 03:29   DG Chest Port 1 View Result Date: 09/18/2023 CLINICAL DATA:  Chest pain EXAM: PORTABLE CHEST 1 VIEW COMPARISON:  None Available. FINDINGS: Large hiatal hernia. Heart and mediastinal contours within normal limits. Left basilar atelectasis adjacent to  the hiatal hernia. No confluent opacity on the right. No effusions or edema. No acute bony abnormality. IMPRESSION: Large hiatal hernia. Compressive atelectasis in the adjacent left lower lobe. No active cardiopulmonary disease. Electronically Signed   By: Franky Crease M.D.   On: 09/18/2023 00:39    Review of Systems  Constitutional:  Positive for appetite change.  HENT: Negative.    Eyes: Negative.   Respiratory: Negative.    Cardiovascular: Negative.   Gastrointestinal:  Positive for nausea and vomiting.       Reports she does get abdominal pain intermittently but is not having any now  Endocrine: Negative.   Genitourinary: Negative.   Musculoskeletal: Negative.   Allergic/Immunologic: Negative.   Neurological: Negative.   Hematological:        Anemia  Psychiatric/Behavioral: Negative.     Blood pressure 139/70, pulse 64, temperature 97.6 F (36.4 C), temperature source Oral, resp. rate 19, height 5' 3 (1.6 m), weight 63.5 kg, SpO2 98%. Physical Exam HENT:     Mouth/Throat:     Mouth: Mucous membranes are moist.  Eyes:     Pupils: Pupils are equal, round, and reactive to light.  Cardiovascular:     Rate and Rhythm: Normal rate and regular rhythm.     Pulses: Normal pulses.  Pulmonary:     Effort: Pulmonary effort is normal.     Breath sounds: Normal breath sounds. No wheezing.  Abdominal:     Palpations: Abdomen is soft.     Tenderness: There is no abdominal tenderness. There is no guarding or rebound.  Musculoskeletal:        General: No tenderness.  Skin:    General: Skin is warm.  Neurological:     Mental Status: She is alert and oriented to person, place, and time.  Psychiatric:        Mood and Affect: Mood normal.     Assessment/Plan: Hiatal hernia causing gastric obstruction -recommend NG tube placement, IV fluids, agree with hospitalist admission.  Plan will be to decompress the stomach using the NG tube.  Hopefully, as the swelling goes down she will be  able to take p.o.  Optimal plan will be robotic hiatal hernia repair by one of my partners in the coming weeks.  No need for emergent surgery at this time.  We will follow closely and make sure she can gradually progress to being able to take orals again.  I discussed the plan in detail with her and answered her questions.  Dann FORBES Hummer 09/18/2023, 5:16 AM

## 2023-09-18 NOTE — ED Notes (Signed)
 Dr. Claudene notified that NG tube came out. Recommended I reach out to surgery.  I have requested that Dr. Georgette Poli be paged.

## 2023-09-18 NOTE — ED Notes (Signed)
 Pt becomes tachycardic with bouts of emesis. HR in the 180s while actively vomiting. Provider aware.

## 2023-09-18 NOTE — H&P (Signed)
 History and Physical    Patient: Shelly Hammond FMW:[id number] DOB: Jul 14, 1950 DOA: 09/17/2023 DOS: the patient was seen and examined on 09/18/2023 PCP: Pcp, No  Patient coming from: Home  Chief Complaint:  Chief Complaint  Patient presents with   Nausea   HPI: Shelly Hammond is a 73 y.o. female with medical history significant of hyperlipidemia, reported MI, prediabetes, asthma, iron deficiency anemia, and hiatal hernia presents with nausea, vomiting, and left shoulder pain.  She has been experiencing nausea and vomiting since Saturday, which she associates with eating rice pilaf containing a spice packet. The vomiting was significant and accompanied by abdominal pain, particularly in the upper abdomen.  Emesis was reported to be brown in color without the presence of blood.  Denies having chest pain.  She has a history of myocardial infarction in 2013 and was on an aspirin regimen post-event.  A previous endoscopy revealed bleeding and tears in her stomach lining, which she attributes to aspirin use, leading her to discontinue it.  She has a large hiatal hernia and a twisted colon, both identified during previous endoscopy and colonoscopy. Due to insurance issues and concerns about network coverage, she has not pursued surgical intervention. She experiences regurgitation and difficulty digesting food, which she believes are related to these conditions.  She has a history of anemia since childhood, requiring dietary adjustments to manage low iron levels. Additionally, she has a history of vertigo, managed with physical therapy and lifestyle changes, including avoiding caffeine and alcohol.  Recently, she experienced left shoulder pain, which initially caused her to be concerned about another heart attack, prompting her visit to the emergency department.   In the emergency department patient was noted to be afebrile with pulse elevated up to 183 in  atrial fibrillation, respirations 15-27, blood  pressures elevated up to 166/87, and O2 saturation maintained on room air.  Labs noted potassium 3.3, glucose 150, calcium  8.8, high-sensitivity troponin negative x 2.  Chest x-ray showed large hiatal hernia with compressive active ectasis in the adjacent left lower lobe.  CT angiogram of the chest noted no signs of pulmonary embolism but a large hiatal hernia with upper to/thirds of the stomach intrathoracic with the stomach dilated with fluid and an abrupt caliber change in junction to the clot atrium and dilated body of the stomach thought to possibly be causing impaired gastric emptying, 3.6 cm left thyroid mass, and 3.3 cm cyst in the inferior pole of the right kidney.  General surgery had been consulted.  NG tube was placed.  Patient has been given 1 L of normal saline IV fluids, potassium chloride  40 meq IV, Protonix  40 mg IV, Zofran , metoprolol  5 mg IV, morphine , and Haldol .   Review of Systems: As mentioned in the history of present illness. All other systems reviewed and are negative. Past Medical History:  Diagnosis Date   Anemia    Asthma    Benign colon polyp    Heart attack (HCC)    Hyperlipidemia    Prediabetes    Past Surgical History:  Procedure Laterality Date   LYMPH NODE BIOPSY     neck   Social History:  reports that she has never smoked. She has never used smokeless tobacco. She reports that she does not drink alcohol and does not use drugs.  Allergies  Allergen Reactions   Peanut Oil Other (See Comments)    Other reaction(s): Other (See Comments) Avoid Peanuts, unknown reaction.   Penicillins Rash and Swelling   Methyldopa Other (See  Comments)    vertigo   Dust Mite Extract     Other reaction(s): Unknown sneezing   Shellfish Allergy Other (See Comments)    Patient cannot recall symptoms    Family History  Problem Relation Age of Onset   Diabetes Mother    Colon cancer Father    Aneurysm Brother     Prior to Admission medications   Medication Sig Start  Date End Date Taking? Authorizing Provider  Multiple Vitamins-Minerals (MULTIVITAMIN WOMENS 50+ ADV) TABS Take 1 tablet by mouth every morning.   Yes [provider]    Physical Exam: Vitals:   09/18/23 0630 09/18/23 0645 09/18/23 0700 09/18/23 0709  BP:  (!) 128/58 120/60   Pulse: 69 73 72 71  Resp: 18 16 20 18   Temp:      TempSrc:      SpO2: 92% 98% 92% 94%  Weight:      Height:       Constitutional: Elderly female currently in no acute distress Eyes: PERRL, lids and conjunctivae normal ENMT: Mucous membranes are dry.  NG tube accidentally dislodged Neck: normal, supple  Respiratory: clear to auscultation bilaterally, no wheezing, no crackles. Normal respiratory effort. No accessory muscle use.  Cardiovascular: Regular rate and rhythm, no murmurs / rubs / gallops. No extremity edema. 2+ pedal pulses.   Abdomen: Soft with some mild discomfort epigastrically with palpation.  Bowel sounds decreased. Musculoskeletal: no clubbing / cyanosis. No joint deformity upper and lower extremities. Good ROM, no contractures. Normal muscle tone.  Skin: no rashes, lesions, ulcers. No induration Neurologic: CN 2-12 grossly intact. Sensation intact, DTR normal. Strength 5/5 in all 4.  Psychiatric: Normal judgment and insight. Alert and oriented x 3. Normal mood.   Data Reviewed:  Initial EKG revealed normal sinus rhythm, but repeat check earlier this morning revealed atrial fibrillation at 183 bpm.  Reviewed labs, imaging, and pertinent records as documented  Assessment and Plan:  Intractable nausea and vomiting Gastric outlet obstruction secondary to large hiatal hernia Patient presented with intractable nausea and vomiting.  Imaging revealed large hiatal hernia with two thirds of the stomach within the chest causing gastric outlet obstruction.  General surgery consulted.  NGT was placed.  Patient had been given IV fluids and pain medication. - Admit to a telemetry bed - N.p.o. -  Continue NGT to suction - Normal saline IV fluids at 100 mL/h - Protonix  IV twice daily - Morphine  IV as needed for pain - Antiemetics as needed - General Surgery consulted, will follow-up for any additional recommendations  New onset atrial fibrillation Patient was noted to have gone into atrial fibrillation with heart rates elevated up to 183. CHA2DS2-VASc score equal to at least 2 based off her age and sex. - Goal potassium at least 4 and magnesium  at least 2.  Check and replace electrolytes to goal - Check TSH - Check echocardiogram  Hypokalemia Acute.  Potassium noted to be 3.3.  Thought secondary to nausea and vomiting.  Patient was given IV replacement of potassium chloride  40 meq IV. - Continue to monitor and replace as needed  Hypocalcemia Acute.  Calcium  just mildly low at 8.8. 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 - Follow-up TSH - Will need thyroid ultrasound    DVT prophylaxis: Lovenox  Advance Care Planning:   Code Status: Full Code   Consults: General  Surgery  Family Communication: None requested when asked  Severity of Illness: The appropriate patient status for this patient is INPATIENT. Inpatient status is judged to be reasonable and necessary in order to provide the required intensity of service to ensure the patient's safety. The patient's presenting symptoms, physical exam findings, and initial radiographic and laboratory data in the context of their chronic comorbidities is felt to place them at high risk for further clinical deterioration. Furthermore, it is not anticipated that the patient will be medically stable for discharge from the hospital within 2 midnights of admission.   * I certify that at the point of admission it is my clinical judgment that the patient will require inpatient hospital care spanning beyond 2 midnights from the point  of admission due to high intensity of service, high risk for further deterioration and high frequency of surveillance required.*  Author: Maximino DELENA Sharps, MD 09/18/2023 7:32 AM  For on call review [email or web address].

## 2023-09-19 DIAGNOSIS — R112 Nausea with vomiting, unspecified: Secondary | ICD-10-CM | POA: Diagnosis not present

## 2023-09-19 LAB — CBC
HCT: 38.2 % (ref 36.0–46.0)
Hemoglobin: 12.9 g/dL (ref 12.0–15.0)
MCH: 30.2 pg (ref 26.0–34.0)
MCHC: 33.8 g/dL (ref 30.0–36.0)
MCV: 89.5 fL (ref 80.0–100.0)
Platelets: 306 K/uL (ref 150–400)
RBC: 4.27 MIL/uL (ref 3.87–5.11)
RDW: 13.7 % (ref 11.5–15.5)
WBC: 11.9 K/uL — ABNORMAL HIGH (ref 4.0–10.5)
nRBC: 0 % (ref 0.0–0.2)

## 2023-09-19 LAB — BASIC METABOLIC PANEL WITH GFR
Anion gap: 14 (ref 5–15)
BUN: 13 mg/dL (ref 8–23)
CO2: 24 mmol/L (ref 22–32)
Calcium: 8.6 mg/dL — ABNORMAL LOW (ref 8.9–10.3)
Chloride: 101 mmol/L (ref 98–111)
Creatinine, Ser: 0.95 mg/dL (ref 0.44–1.00)
GFR, Estimated: >60 mL/min (ref >60)
Glucose, Bld: 124 mg/dL — ABNORMAL HIGH (ref 70–99)
Potassium: 3.4 mmol/L — ABNORMAL LOW (ref 3.5–5.1)
Sodium: 139 mmol/L (ref 135–145)

## 2023-09-19 MED ORDER — SODIUM CHLORIDE 0.9 % IV SOLN: INTRAVENOUS | Status: DC

## 2023-09-19 NOTE — Progress Notes (Signed)
 [facility] Progress Note     Subjective: CC:  NG replaced twice, vomiting around tube overnight.  No BM yet.   Objective: Vital signs in last 24 hours: Temp:  [98.7 F (37.1 C)-99.4 F (37.4 C)] 98.7 F (37.1 C) (09/09 0441) Pulse Rate:  [70-83] 72 (09/09 0806) Resp:  [18-21] 18 (09/09 0806) BP: (139-148)/(69-76) 147/76 (09/09 0806) SpO2:  [90 %-98 %] 90 % (09/09 0806) Weight:  [68.9 kg] 68.9 kg (09/08 1300)    Intake/Output from previous day: 09/08 0701 - 09/09 0700 In: 2146.6 [I.V.:2146.6] Out: 575 [Emesis/NG output:575] Intake/Output this shift: No intake/output data recorded.  PE: Gen:  Alert, NAD, pleasant Card:  HR 70's during my exam Pulm:  Normal effort ORA Abd: Soft, non-tender, non-distended  NG w/ ~ 500 cc brown effluent in cannister. Not funtioning during my exam. Flushed blur port with air, flushed NG w/ 20 mL tap water, replaced one of the tubing connectors, and got back ~75-100 mL bilious fluid.  Skin: warm and dry, no rashes  Psych: A&Ox3   Lab Results:  Recent Labs    09/18/23 0020 09/19/23 0804  WBC 9.8 11.9*  HGB 13.0 12.9  HCT 39.3 38.2  PLT 295 306   BMET Recent Labs    09/18/23 0020 09/19/23 0804  NA 140 139  K 3.3* 3.4*  CL 103 101  CO2 24 24  GLUCOSE 150* 124*  BUN 15 13  CREATININE 0.96 0.95  CALCIUM  8.8* 8.6*   PT/INR No results for input(s): LABPROT, INR in the last 72 hours. CMP     Component Value Date/Time   NA 139 09/19/2023 0804   K 3.4 (L) 09/19/2023 0804   CL 101 09/19/2023 0804   CO2 24 09/19/2023 0804   GLUCOSE 124 (H) 09/19/2023 0804   BUN 13 09/19/2023 0804   CREATININE 0.95 09/19/2023 0804   CREATININE 1.01 (H) 07/27/2021 0844   CREATININE 0.82 05/04/2017 0957   CALCIUM  8.6 (L) 09/19/2023 0804   PROT 7.5 09/18/2023 0020   ALBUMIN 3.7 09/18/2023 0020   AST 19 09/18/2023 0020   AST 14 (L) 07/27/2021 0844   ALT 15 09/18/2023 0020   ALT 11 07/27/2021 0844   ALKPHOS 69 09/18/2023  0020   BILITOT 0.7 09/18/2023 0020   BILITOT 0.4 07/27/2021 0844   GFRNONAA >60 09/19/2023 0804   GFRNONAA >60 07/27/2021 0844   GFRNONAA 75 05/04/2017 0957   GFRAA 87 05/04/2017 0957   Lipase     Component Value Date/Time   LIPASE 35 09/18/2023 0020       Studies/Results: DG Chest Port 1 View Result Date: 09/18/2023 EXAM: 1 VIEW XRAY OF THE CHEST 09/18/2023 10:28:00 PM COMPARISON: 09/18/2023 CLINICAL HISTORY: SOB (shortness of breath) 141880. SOB FINDINGS: LUNGS AND PLEURA: Left basilar atelectasis. Right basilar linear atelectasis. No pleural effusion. No pneumothorax. HEART AND MEDIASTINUM: Large hiatal hernia. BONES AND SOFT TISSUES: No acute osseous abnormality. Atherosclerotic plaque noted. Enteric tube in place with tip overlying expected gastric lumen. IMPRESSION: 1. Bibasilar atelectasis. 2. Large hiatal hernia. Electronically signed by: Norman Gatlin MD 09/18/2023 10:37 PM EDT RP Workstation: [id number]   DG Abdomen 1 View Result Date: 09/18/2023 EXAM: 1 VIEW XRAY OF THE ABDOMEN 09/18/2023 12:35:23 PM COMPARISON: Abdominal radiograph dated 09/18/2023. CLINICAL HISTORY: NG tube placement. FINDINGS: BOWEL: Nonobstructive bowel gas pattern. SOFT TISSUES: No opaque urinary calculi. Surgical clips are present in the gallbladder fossa. BONES: No acute osseous abnormality. There is elevation of the left hemidiaphragm. LINES AND TUBES:  A gastric tube remains with its tip along the greater curvature of the mid body of the stomach. IMPRESSION: 1. Gastric tube in place with its step along the greater curvature of the mid body of the stomach. 2. Elevation of the left hemidiaphragm. 3. Surgical clips in the gallbladder fossa. Electronically signed by: Evalene Coho MD 09/18/2023 12:52 PM EDT RP Workstation: [id number]   ECHOCARDIOGRAM COMPLETE Result Date: 09/18/2023    ECHOCARDIOGRAM REPORT   Patient Name:   Shelly Hammond Date of Exam: 09/18/2023 Medical Rec #:  [id number]   Height:       63.0 in  Accession #:    [id number]  Weight:       140.0 lb Date of Birth:  1950-05-13   BSA:          1.662 m Patient Age:    73 years    BP:           131/81 mmHg Patient Gender: F           HR:           [id number] bpm. Exam Location:  Inpatient Procedure: 2D Echo, Cardiac Doppler and Color Doppler (Both Spectral and Color            Flow Doppler were utilized during procedure). Indications:    Atrial Fibrillation I48.91  History:        Patient has no prior history of Echocardiogram examinations.  Sonographer:    Tinnie Gosling RDCS Referring Phys: [phone] RONDELL A SMITH IMPRESSIONS  1. Left ventricular ejection fraction, by estimation, is 60 to 65%. The left ventricle has normal function. The left ventricle has no regional wall motion abnormalities. Left ventricular diastolic parameters were normal.  2. Right ventricular systolic function is normal. The right ventricular size is normal.  3. Mobile calcified chords . The mitral valve is abnormal. Trivial mitral valve regurgitation. No evidence of mitral stenosis.  4. The aortic valve is tricuspid. Aortic valve regurgitation is not visualized. No aortic stenosis is present.  5. The inferior vena cava is normal in size with greater than 50% respiratory variability, suggesting right atrial pressure of 3 mmHg. FINDINGS  Left Ventricle: Left ventricular ejection fraction, by estimation, is 60 to 65%. The left ventricle has normal function. The left ventricle has no regional wall motion abnormalities. Strain was performed and the global longitudinal strain is indeterminate. The left ventricular internal cavity size was normal in size. There is no left ventricular hypertrophy. Left ventricular diastolic parameters were normal. Right Ventricle: The right ventricular size is normal. No increase in right ventricular wall thickness. Right ventricular systolic function is normal. Left Atrium: Left atrial size was normal in size. Right Atrium: Right atrial size was normal in size.  Pericardium: There is no evidence of pericardial effusion. Mitral Valve: Mobile calcified chords. The mitral valve is abnormal. There is mild thickening of the mitral valve leaflet(s). Trivial mitral valve regurgitation. No evidence of mitral valve stenosis. Tricuspid Valve: The tricuspid valve is normal in structure. Tricuspid valve regurgitation is mild . No evidence of tricuspid stenosis. Aortic Valve: The aortic valve is tricuspid. Aortic valve regurgitation is not visualized. No aortic stenosis is present. Pulmonic Valve: The pulmonic valve was normal in structure. Pulmonic valve regurgitation is not visualized. No evidence of pulmonic stenosis. Aorta: The aortic root is normal in size and structure. Venous: The inferior vena cava is normal in size with greater than 50% respiratory variability, suggesting right atrial pressure of 3 mmHg. IAS/Shunts: No atrial  level shunt detected by color flow Doppler. Additional Comments: 3D was performed not requiring image post processing on an independent workstation and was indeterminate.  LEFT VENTRICLE PLAX 2D LVIDd:         3.90 cm   Diastology LVIDs:         2.60 cm   LV e' medial:    7.40 cm/s LV PW:         1.00 cm   LV E/e' medial:  19.2 LV IVS:        0.90 cm   LV e' lateral:   12.80 cm/s LVOT diam:     1.90 cm   LV E/e' lateral: 11.1 LV SV:         97 LV SV Index:   59 LVOT Area:     2.84 cm  RIGHT VENTRICLE             IVC RV S prime:     13.20 cm/s  IVC diam: 1.90 cm TAPSE (M-mode): 2.7 cm LEFT ATRIUM             Index        RIGHT ATRIUM           Index LA diam:        2.60 cm 1.56 cm/m   RA Area:     11.50 cm LA Vol (A2C):   37.5 ml 22.57 ml/m  RA Volume:   26.50 ml  15.95 ml/m LA Vol (A4C):   74.5 ml 44.83 ml/m LA Biplane Vol: 54.1 ml 32.56 ml/m  AORTIC VALVE LVOT Vmax:   143.00 cm/s LVOT Vmean:  104.000 cm/s LVOT VTI:    0.343 m  AORTA Ao Root diam: 2.90 cm Ao Asc diam:  3.30 cm MITRAL VALVE MV Area (PHT): 2.92 cm     SHUNTS MV Decel Time: 260 msec      Systemic VTI:  0.34 m MV E velocity: 142.00 cm/s  Systemic Diam: 1.90 cm MV A velocity: 122.00 cm/s MV E/A ratio:  1.16 Maude Emmer MD Electronically signed by Maude Emmer MD Signature Date/Time: 09/18/2023/10:49:25 AM    Final    DG Abd Portable 1 View Result Date: 09/18/2023 CLINICAL DATA:  NGT insertion film. EXAM: PORTABLE ABDOMEN - 1 VIEW COMPARISON:  CTA chest and CT abdomen and pelvis earlier today. FINDINGS: 5:34 a.m. The abdomen is included to the level of the iliac crests. NGT has been inserted, and terminates in the body of the stomach to the left. There is a large hiatal hernia with partially intrathoracic stomach. The bowel pattern is nonobstructive. There are cholecystectomy clips. No other significant findings or changes. IMPRESSION: 1. NGT terminates in the body of the stomach to the left. 2. Large hiatal hernia with partially intrathoracic stomach. Electronically Signed   By: Francis Quam M.D.   On: 09/18/2023 05:51   CT Angio Chest Pulmonary Embolism (PE) W or WO Contrast Result Date: 09/18/2023 CLINICAL DATA:  Chest and abdomen pain. Bowel obstruction suspected. EXAM: CT ANGIOGRAPHY CHEST CT ABDOMEN AND PELVIS WITH CONTRAST TECHNIQUE: Multidetector CT imaging of the chest was performed using the standard protocol during bolus administration of intravenous contrast. Multiplanar CT image reconstructions and MIPs were obtained to evaluate the vascular anatomy. Multidetector CT imaging of the abdomen and pelvis was performed using the standard protocol during bolus administration of intravenous contrast. RADIATION DOSE REDUCTION: This exam was performed according to the departmental dose-optimization program which includes automated exposure control, adjustment of the mA and/or  kV according to patient size and/or use of iterative reconstruction technique. CONTRAST:  75mL OMNIPAQUE  IOHEXOL  350 MG/ML SOLN COMPARISON:  Portable chest today is the only relevant prior. FINDINGS: CTA CHEST FINDINGS  Cardiovascular: There is mild cardiomegaly. The dorsal walls of the left atrium and ventricle are mildly effaced due to a large hiatal hernia. There is no pericardial effusion no visible coronary calcification. Pulmonary arteries are normal caliber without reference are. There is tortuosity and scattered calcific plaques of the aorta. Minimal calcification in the great vessels. No aneurysm, stenosis or dissection. There is no venous dilatation. Mediastinum/Nodes: Large hiatal hernia with upper 2/3 of the femoral intrathoracic with the stomach dilated with fluid, scattered fluid in the thoracic esophagus. This places the patient at risk for aspiration. Aspiration precautions recommended unless already being done. There is a heterogeneous mass in the left thyroid lobe displacing the trachea to the right. It is not well seen due to metal artifact from an overlying necklace, but it measures at least 3.6 cm. Nonemergent follow-up ultrasound is recommended. The right lobe, both axillary spaces are unremarkable. No intrathoracic adenopathy is seen. Negative thoracic trachea. No esophageal wall thickening. Lungs/Pleura: There is compressive atelectasis in the lower lobes alongside the large hiatal hernia There are additional scattered linear scar-like opacities in the lung bases and mild posterior and lateral basal atelectasis. No infiltrate, nodule or effusion are seen. No pneumothorax. Limited fine detail due to respiratory motion. Musculoskeletal: There is kyphosis and degenerative change of the thoracic spine. No spinal compression fracture. No acute or other significant osseous findings. Unremarkable chest wall. Review of the MIP images confirms the above findings. CT ABDOMEN and PELVIS FINDINGS Hepatobiliary: There is a calcified subcapsular granuloma inferiorly in segment 6. Remainder of the liver is unremarkable. Gallbladder is absent without biliary dilatation. Pancreas: No abnormality. Spleen: No abnormality.  No  splenomegaly. Adrenals/Urinary Tract: There is no adrenal mass. There is a 3.3 cm Bosniak 1 cyst in the inferior pole of the right kidney, Hounsfield density 11. No follow-up imaging is recommended. The remainder of both kidneys enhance uniformly without mass enhancement bilaterally. There is no urinary stone or obstruction. There is no bladder thickening. Stomach/Bowel: As above, the upper 2/3 of the stomach is intrathoracic due to a large hiatal hernia, the stomach dilated with fluid both above and below the hernia mouth. There is an abrupt caliber change at the junction the collapsed antrum and dilated body of the stomach, which is just below the anterior lip of the diaphragmatic hernia and could be being compressed by the diaphragm. In any case there does appear to be impaired gastric emptying through this area. For reference, refer to coronal series 6 image 67, and sagittal series 7 images 56-60. The unopacified small bowel is normal caliber without inflammatory changes or overt wall thickening. The appendix is normal, retrocecal location. The large bowel is contracted for the most part. There is advanced sigmoid diverticulosis but no evidence of acute diverticulitis. Vascular/Lymphatic: Aortic atherosclerosis. No enlarged abdominal or pelvic lymph nodes. Reproductive: 1.7 cm calcified fibroid noted over the right lateral body of uterus. The uterus is not enlarged nor otherwise unremarkable. The ovaries are normal in size. Other: No abdominal wall hernia, and no free fluid, free hemorrhage or free air are seen. Musculoskeletal: No acute or significant regional osseous findings. Review of the MIP images confirms the above findings. IMPRESSION: 1. No evidence of pulmonary arterial dilatation or embolus. 2. Aortic atherosclerosis. 3. Large hiatal hernia with upper 2/3 of the  stomach intrathoracic, with the stomach dilated with fluid, and with an abrupt caliber change at the junction of the collapsed antrum and  dilated body of the stomach. This junction is just below the anterior lip of the diaphragmatic hernia and could be being compressed by the diaphragm. In any case there does appear to be impaired gastric emptying through this area. 4. Fluid in the thoracic esophagus. Aspiration precautions recommended unless already being done. 5. 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 Nonemergent follow-up ultrasound is recommended. 6. 3.3 cm Bosniak 1 cyst inferior pole right kidney. No follow-up imaging recommended. 7. Advanced sigmoid diverticulosis without evidence of diverticulitis. 8. 1.7 cm calcified uterine fibroid. Aortic Atherosclerosis (ICD10-I70.0). Electronically Signed   By: Francis Quam M.D.   On: 09/18/2023 03:29   CT ABDOMEN PELVIS W CONTRAST Result Date: 09/18/2023 CLINICAL DATA:  Chest and abdomen pain. Bowel obstruction suspected. EXAM: CT ANGIOGRAPHY CHEST CT ABDOMEN AND PELVIS WITH CONTRAST TECHNIQUE: Multidetector CT imaging of the chest was performed using the standard protocol during bolus administration of intravenous contrast. Multiplanar CT image reconstructions and MIPs were obtained to evaluate the vascular anatomy. Multidetector CT imaging of the abdomen and pelvis was performed using the standard protocol during bolus administration of intravenous contrast. RADIATION DOSE REDUCTION: This exam was performed according to the departmental dose-optimization program which includes automated exposure control, adjustment of the mA and/or kV according to patient size and/or use of iterative reconstruction technique. CONTRAST:  75mL OMNIPAQUE  IOHEXOL  350 MG/ML SOLN COMPARISON:  Portable chest today is the only relevant prior. FINDINGS: CTA CHEST FINDINGS Cardiovascular: There is mild cardiomegaly. The dorsal walls of the left atrium and ventricle are mildly effaced due to a large hiatal hernia. There is no pericardial effusion no visible coronary calcification. Pulmonary arteries are normal caliber without  reference are. There is tortuosity and scattered calcific plaques of the aorta. Minimal calcification in the great vessels. No aneurysm, stenosis or dissection. There is no venous dilatation. Mediastinum/Nodes: Large hiatal hernia with upper 2/3 of the femoral intrathoracic with the stomach dilated with fluid, scattered fluid in the thoracic esophagus. This places the patient at risk for aspiration. Aspiration precautions recommended unless already being done. There is a heterogeneous mass in the left thyroid lobe displacing the trachea to the right. It is not well seen due to metal artifact from an overlying necklace, but it measures at least 3.6 cm. Nonemergent follow-up ultrasound is recommended. The right lobe, both axillary spaces are unremarkable. No intrathoracic adenopathy is seen. Negative thoracic trachea. No esophageal wall thickening. Lungs/Pleura: There is compressive atelectasis in the lower lobes alongside the large hiatal hernia There are additional scattered linear scar-like opacities in the lung bases and mild posterior and lateral basal atelectasis. No infiltrate, nodule or effusion are seen. No pneumothorax. Limited fine detail due to respiratory motion. Musculoskeletal: There is kyphosis and degenerative change of the thoracic spine. No spinal compression fracture. No acute or other significant osseous findings. Unremarkable chest wall. Review of the MIP images confirms the above findings. CT ABDOMEN and PELVIS FINDINGS Hepatobiliary: There is a calcified subcapsular granuloma inferiorly in segment 6. Remainder of the liver is unremarkable. Gallbladder is absent without biliary dilatation. Pancreas: No abnormality. Spleen: No abnormality.  No splenomegaly. Adrenals/Urinary Tract: There is no adrenal mass. There is a 3.3 cm Bosniak 1 cyst in the inferior pole of the right kidney, Hounsfield density 11. No follow-up imaging is recommended. The remainder of both kidneys enhance uniformly without  mass enhancement bilaterally. There is  no urinary stone or obstruction. There is no bladder thickening. Stomach/Bowel: As above, the upper 2/3 of the stomach is intrathoracic due to a large hiatal hernia, the stomach dilated with fluid both above and below the hernia mouth. There is an abrupt caliber change at the junction the collapsed antrum and dilated body of the stomach, which is just below the anterior lip of the diaphragmatic hernia and could be being compressed by the diaphragm. In any case there does appear to be impaired gastric emptying through this area. For reference, refer to coronal series 6 image 67, and sagittal series 7 images 56-60. The unopacified small bowel is normal caliber without inflammatory changes or overt wall thickening. The appendix is normal, retrocecal location. The large bowel is contracted for the most part. There is advanced sigmoid diverticulosis but no evidence of acute diverticulitis. Vascular/Lymphatic: Aortic atherosclerosis. No enlarged abdominal or pelvic lymph nodes. Reproductive: 1.7 cm calcified fibroid noted over the right lateral body of uterus. The uterus is not enlarged nor otherwise unremarkable. The ovaries are normal in size. Other: No abdominal wall hernia, and no free fluid, free hemorrhage or free air are seen. Musculoskeletal: No acute or significant regional osseous findings. Review of the MIP images confirms the above findings. IMPRESSION: 1. No evidence of pulmonary arterial dilatation or embolus. 2. Aortic atherosclerosis. 3. Large hiatal hernia with upper 2/3 of the stomach intrathoracic, with the stomach dilated with fluid, and with an abrupt caliber change at the junction of the collapsed antrum and dilated body of the stomach. This junction is just below the anterior lip of the diaphragmatic hernia and could be being compressed by the diaphragm. In any case there does appear to be impaired gastric emptying through this area. 4. Fluid in the thoracic  esophagus. Aspiration precautions recommended unless already being done. 5. 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 Nonemergent follow-up ultrasound is recommended. 6. 3.3 cm Bosniak 1 cyst inferior pole right kidney. No follow-up imaging recommended. 7. Advanced sigmoid diverticulosis without evidence of diverticulitis. 8. 1.7 cm calcified uterine fibroid. Aortic Atherosclerosis (ICD10-I70.0). Electronically Signed   By: Francis Quam M.D.   On: 09/18/2023 03:29   DG Chest Port 1 View Result Date: 09/18/2023 CLINICAL DATA:  Chest pain EXAM: PORTABLE CHEST 1 VIEW COMPARISON:  None Available. FINDINGS: Large hiatal hernia. Heart and mediastinal contours within normal limits. Left basilar atelectasis adjacent to the hiatal hernia. No confluent opacity on the right. No effusions or edema. No acute bony abnormality. IMPRESSION: Large hiatal hernia. Compressive atelectasis in the adjacent left lower lobe. No active cardiopulmonary disease. Electronically Signed   By: Franky Crease M.D.   On: 09/18/2023 00:39    Anti-infectives: Anti-infectives (From admission, onward)    None        Assessment/Plan  Hiatal hernia causing gastric obstruction  Continue NG decompression today, NG was not reliably functioning overnight.   WBC 11.9 today, up from 9.8, trend   Hopefully, as the swelling goes down she will be able to take p.o. Optimal plan will be robotic hiatal hernia repair by one of my partners in the coming weeks. No need for emergent surgery at this time. We will follow closely and make sure she can gradually progress to being able to take orals again.   Patient tells me she has a history of Colon twist, I will look into this, no signs of colonic volvulus on her admission imaging.   FEN: NPO, IVF ID: none VTE: SCDs, daily lovenox    LOS:  1 day   I reviewed nursing notes, hospitalist notes, last 24 h vitals and pain scores, last 48 h intake and output, last 24 h labs and trends, and last 24 h imaging  results.  This care required moderate level of medical decision making.   Almarie Pringle, PA-C [facility] Please see Amion for pager number during day hours 7:00am-4:30pm

## 2023-09-19 NOTE — Progress Notes (Signed)
 PROGRESS NOTE    Shelly Hammond  FMW:[id number] DOB: Nov 01, 1950 DOA: 09/17/2023 PCP: Trudy Mliss Dragon, MD   Brief Narrative:   73 y.o. female with medical history significant of hyperlipidemia, reported MI, prediabetes, asthma, iron deficiency anemia, and hiatal hernia presented with nausea, vomiting and abdominal pain.  On presentation, she was tachycardic, hypertensive with high-sensitivity troponins x 2 negative; chest x-ray showed large hiatal hernia with compressive atelectasis in the adjacent left lower lobe.  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  General surgery was consulted.  NG tube was placed.  She was started on IV fluids.  Assessment & Plan:   Intractable nausea and vomiting Gastric outlet obstruction secondary to large hiatal hernia -Imaging as above. -General Surgery following.  Continue NG tube and IV fluids.  Continue IV Protonix .  IV analgesics and antiemetics as needed.  Paroxysmal A-fib with RVR -Patient was briefly in A-fib with heart rates up to 183 on presentation.  Currently rate controlled.  Echo showed EF of 60 to 65%.  Hypokalemia - Labs pending for today.  Thyroid mass - Imaging showed 3.6 cm left thyroid mass.  TFTs normal.  Outpatient follow-up with endocrinology/PCP with nonemergent thyroid ultrasound   DVT prophylaxis: Lovenox  Code Status: Full Family Communication: None at bedside Disposition Plan: Status is: Inpatient Remains inpatient appropriate because: Of severity of illness    Consultants: General surgery  Procedures: 2D echo  Antimicrobials: None   Subjective: Patient seen  and examined at bedside.  Still complains of some intermittent nausea.  Had an episode of vomiting this morning.  Denies worsening abdominal pain.  No fever or chest pain reported.  Objective: Vitals:   09/18/23 1318 09/18/23 1559 09/18/23 2118 09/19/23 0441  BP: (!) 147/71 (!) 148/69 (!) 144/75 139/74  Pulse: 70 76 78 83  Resp: (!) 21  18 18   Temp: 99.4 F (37.4 C) 99 F (37.2 C) 98.8 F (37.1 C) 98.7 F (37.1 C)  TempSrc: Oral Oral Oral Oral  SpO2: 95% 98% 92% 93%  Weight:      Height:        Intake/Output Summary (Last 24 hours) at 09/19/2023 9272 Last data filed at 09/19/2023 0600 Gross per 24 hour  Intake 2146.64 ml  Output 350 ml  Net 1796.64 ml   Filed Weights   09/17/23 2334 09/18/23 1300  Weight: 63.5 kg 68.9 kg    Examination:  General exam: Appears calm and comfortable  ENT: NG tube present Respiratory system: Bilateral decreased breath sounds at bases, no wheezing Cardiovascular system: S1 & S2 heard, Rate controlled Gastrointestinal system: Abdomen is nondistended, soft and nontender. Normal bowel sounds heard. Extremities: No cyanosis, clubbing, edema  Central nervous system: Alert and oriented. No focal neurological deficits. Moving extremities Skin: No rashes, lesions or ulcers Psychiatry: Flat affect.  Not agitated.   Data Reviewed: I have personally reviewed following labs and imaging studies  CBC: Recent Labs  Lab 09/18/23 0020  WBC 9.8  HGB 13.0  HCT 39.3  MCV 91.0  PLT 295   Basic Metabolic Panel: Recent Labs  Lab 09/18/23 0020 09/18/23 0843  NA 140  --  K 3.3*  --   CL 103  --   CO2 24  --   GLUCOSE 150*  --   BUN 15  --   CREATININE 0.96  --   CALCIUM  8.8*  --   MG  --  1.9   GFR: Estimated Creatinine Clearance: 50.1 mL/min (by C-G formula based on SCr of 0.96 mg/dL). Liver Function Tests: Recent Labs  Lab 09/18/23 0020  AST 19  ALT 15  ALKPHOS 69  BILITOT 0.7  PROT 7.5  ALBUMIN 3.7   Recent Labs  Lab  09/18/23 0020  LIPASE 35   No results for input(s): AMMONIA in the last 168 hours. Coagulation Profile: No results for input(s): INR, PROTIME in the last 168 hours. Cardiac Enzymes: No results for input(s): CKTOTAL, CKMB, CKMBINDEX, TROPONINI in the last 168 hours. BNP (last 3 results) No results for input(s): PROBNP in the last 8760 hours. HbA1C: No results for input(s): HGBA1C in the last 72 hours. CBG: No results for input(s): GLUCAP in the last 168 hours. Lipid Profile: No results for input(s): CHOL, HDL, LDLCALC, TRIG, CHOLHDL, LDLDIRECT in the last 72 hours. Thyroid Function Tests: Recent Labs    09/18/23 0843  TSH 0.630   Anemia Panel: No results for input(s): VITAMINB12, FOLATE, FERRITIN, TIBC, IRON, RETICCTPCT in the last 72 hours. Sepsis Labs: No results for input(s): PROCALCITON, LATICACIDVEN in the last 168 hours.  No results found for this or any previous visit (from the past 240 hours).       Radiology Studies: DG Chest Port 1 View Result Date: 09/18/2023 EXAM: 1 VIEW XRAY OF THE CHEST 09/18/2023 10:28:00 PM COMPARISON: 09/18/2023 CLINICAL HISTORY: SOB (shortness of breath) 141880. SOB FINDINGS: LUNGS AND PLEURA: Left basilar atelectasis. Right basilar linear atelectasis. No pleural effusion. No pneumothorax. HEART AND MEDIASTINUM: Large hiatal hernia. BONES AND SOFT TISSUES: No acute osseous abnormality. Atherosclerotic plaque noted. Enteric tube in place with tip overlying expected gastric lumen. IMPRESSION: 1. Bibasilar atelectasis. 2. Large hiatal hernia. Electronically signed by: Norman Gatlin MD 09/18/2023 10:37 PM EDT RP Workstation: [id number]   DG Abdomen 1 View Result Date: 09/18/2023 EXAM: 1 VIEW XRAY OF THE ABDOMEN 09/18/2023 12:35:23 PM COMPARISON: Abdominal radiograph dated 09/18/2023. CLINICAL HISTORY: NG tube placement. FINDINGS: BOWEL: Nonobstructive bowel gas pattern. SOFT TISSUES: No opaque urinary  calculi. Surgical clips are present in the gallbladder fossa. BONES: No acute osseous abnormality. There is elevation of the left hemidiaphragm. LINES AND TUBES: A gastric tube remains with its tip along the greater curvature of the mid body of the stomach. IMPRESSION: 1. Gastric tube in place with its step along the greater curvature of the mid body of the stomach. 2. Elevation of the left hemidiaphragm. 3. Surgical clips in the gallbladder fossa. Electronically signed by: Evalene Coho MD 09/18/2023 12:52 PM EDT RP Workstation: [id number]   ECHOCARDIOGRAM COMPLETE Result Date: 09/18/2023    ECHOCARDIOGRAM REPORT   Patient Name:   Shelly Hammond Date of Exam: 09/18/2023 Medical Rec #:  [id number]   Height:       63.0 in Accession #:    [id number]  Weight:       140.0 lb Date of Birth:  [birth year]   BSA:          1.662 m Patient Age:    73 years    BP:           131/81 mmHg Patient Gender: F           HR:  [id number] bpm. Exam Location:  Inpatient Procedure: 2D Echo, Cardiac Doppler and Color Doppler (Both Spectral and Color            Flow Doppler were utilized during procedure). Indications:    Atrial Fibrillation I48.91  History:        Patient has no prior history of Echocardiogram examinations.  Sonographer:    Tinnie Gosling RDCS Referring Phys: [phone] RONDELL A SMITH IMPRESSIONS  1. Left ventricular ejection fraction, by estimation, is 60 to 65%. The left ventricle has normal function. The left ventricle has no regional wall motion abnormalities. Left ventricular diastolic parameters were normal.  2. Right ventricular systolic function is normal. The right ventricular size is normal.  3. Mobile calcified chords . The mitral valve is abnormal. Trivial mitral valve regurgitation. No evidence of mitral stenosis.  4. The aortic valve is tricuspid. Aortic valve regurgitation is not visualized. No aortic stenosis is present.  5. The inferior vena cava is normal in size with greater than 50% respiratory  variability, suggesting right atrial pressure of 3 mmHg. FINDINGS  Left Ventricle: Left ventricular ejection fraction, by estimation, is 60 to 65%. The left ventricle has normal function. The left ventricle has no regional wall motion abnormalities. Strain was performed and the global longitudinal strain is indeterminate. The left ventricular internal cavity size was normal in size. There is no left ventricular hypertrophy. Left ventricular diastolic parameters were normal. Right Ventricle: The right ventricular size is normal. No increase in right ventricular wall thickness. Right ventricular systolic function is normal. Left Atrium: Left atrial size was normal in size. Right Atrium: Right atrial size was normal in size. Pericardium: There is no evidence of pericardial effusion. Mitral Valve: Mobile calcified chords. The mitral valve is abnormal. There is mild thickening of the mitral valve leaflet(s). Trivial mitral valve regurgitation. No evidence of mitral valve stenosis. Tricuspid Valve: The tricuspid valve is normal in structure. Tricuspid valve regurgitation is mild . No evidence of tricuspid stenosis. Aortic Valve: The aortic valve is tricuspid. Aortic valve regurgitation is not visualized. No aortic stenosis is present. Pulmonic Valve: The pulmonic valve was normal in structure. Pulmonic valve regurgitation is not visualized. No evidence of pulmonic stenosis. Aorta: The aortic root is normal in size and structure. Venous: The inferior vena cava is normal in size with greater than 50% respiratory variability, suggesting right atrial pressure of 3 mmHg. IAS/Shunts: No atrial level shunt detected by color flow Doppler. Additional Comments: 3D was performed not requiring image post processing on an independent workstation and was indeterminate.  LEFT VENTRICLE PLAX 2D LVIDd:         3.90 cm   Diastology LVIDs:         2.60 cm   LV e' medial:    7.40 cm/s LV PW:         1.00 cm   LV E/e' medial:  19.2 LV IVS:         0.90 cm   LV e' lateral:   12.80 cm/s LVOT diam:     1.90 cm   LV E/e' lateral: 11.1 LV SV:         97 LV SV Index:   59 LVOT Area:     2.84 cm  RIGHT VENTRICLE             IVC RV S prime:     13.20 cm/s  IVC diam: 1.90 cm TAPSE (M-mode): 2.7 cm LEFT ATRIUM  Index        RIGHT ATRIUM           Index LA diam:        2.60 cm 1.56 cm/m   RA Area:     11.50 cm LA Vol (A2C):   37.5 ml 22.57 ml/m  RA Volume:   26.50 ml  15.95 ml/m LA Vol (A4C):   74.5 ml 44.83 ml/m LA Biplane Vol: 54.1 ml 32.56 ml/m  AORTIC VALVE LVOT Vmax:   143.00 cm/s LVOT Vmean:  104.000 cm/s LVOT VTI:    0.343 m  AORTA Ao Root diam: 2.90 cm Ao Asc diam:  3.30 cm MITRAL VALVE MV Area (PHT): 2.92 cm     SHUNTS MV Decel Time: 260 msec     Systemic VTI:  0.34 m MV E velocity: 142.00 cm/s  Systemic Diam: 1.90 cm MV A velocity: 122.00 cm/s MV E/A ratio:  1.16 Maude Emmer MD Electronically signed by Maude Emmer MD Signature Date/Time: 09/18/2023/10:49:25 AM    Final    DG Abd Portable 1 View Result Date: 09/18/2023 CLINICAL DATA:  NGT insertion film. EXAM: PORTABLE ABDOMEN - 1 VIEW COMPARISON:  CTA chest and CT abdomen and pelvis earlier today. FINDINGS: 5:34 a.m. The abdomen is included to the level of the iliac crests. NGT has been inserted, and terminates in the body of the stomach to the left. There is a large hiatal hernia with partially intrathoracic stomach. The bowel pattern is nonobstructive. There are cholecystectomy clips. No other significant findings or changes. IMPRESSION: 1. NGT terminates in the body of the stomach to the left. 2. Large hiatal hernia with partially intrathoracic stomach. Electronically Signed   By: Francis Quam M.D.   On: 09/18/2023 05:51   CT Angio Chest Pulmonary Embolism (PE) W or WO Contrast Result Date: 09/18/2023 CLINICAL DATA:  Chest and abdomen pain. Bowel obstruction suspected. EXAM: CT ANGIOGRAPHY CHEST CT ABDOMEN AND PELVIS WITH CONTRAST TECHNIQUE: Multidetector CT imaging of the chest  was performed using the standard protocol during bolus administration of intravenous contrast. Multiplanar CT image reconstructions and MIPs were obtained to evaluate the vascular anatomy. Multidetector CT imaging of the abdomen and pelvis was performed using the standard protocol during bolus administration of intravenous contrast. RADIATION DOSE REDUCTION: This exam was performed according to the departmental dose-optimization program which includes automated exposure control, adjustment of the mA and/or kV according to patient size and/or use of iterative reconstruction technique. CONTRAST:  75mL OMNIPAQUE  IOHEXOL  350 MG/ML SOLN COMPARISON:  Portable chest today is the only relevant prior. FINDINGS: CTA CHEST FINDINGS Cardiovascular: There is mild cardiomegaly. The dorsal walls of the left atrium and ventricle are mildly effaced due to a large hiatal hernia. There is no pericardial effusion no visible coronary calcification. Pulmonary arteries are normal caliber without reference are. There is tortuosity and scattered calcific plaques of the aorta. Minimal calcification in the great vessels. No aneurysm, stenosis or dissection. There is no venous dilatation. Mediastinum/Nodes: Large hiatal hernia with upper 2/3 of the femoral intrathoracic with the stomach dilated with fluid, scattered fluid in the thoracic esophagus. This places the patient at risk for aspiration. Aspiration precautions recommended unless already being done. There is a heterogeneous mass in the left thyroid lobe displacing the trachea to the right. It is not well seen due to metal artifact from an overlying necklace, but it measures at least 3.6 cm. Nonemergent follow-up ultrasound is recommended. The right lobe, both axillary spaces are unremarkable. No intrathoracic adenopathy is  seen. Negative thoracic trachea. No esophageal wall thickening. Lungs/Pleura: There is compressive atelectasis in the lower lobes alongside the large hiatal hernia  There are additional scattered linear scar-like opacities in the lung bases and mild posterior and lateral basal atelectasis. No infiltrate, nodule or effusion are seen. No pneumothorax. Limited fine detail due to respiratory motion. Musculoskeletal: There is kyphosis and degenerative change of the thoracic spine. No spinal compression fracture. No acute or other significant osseous findings. Unremarkable chest wall. Review of the MIP images confirms the above findings. CT ABDOMEN and PELVIS FINDINGS Hepatobiliary: There is a calcified subcapsular granuloma inferiorly in segment 6. Remainder of the liver is unremarkable. Gallbladder is absent without biliary dilatation. Pancreas: No abnormality. Spleen: No abnormality.  No splenomegaly. Adrenals/Urinary Tract: There is no adrenal mass. There is a 3.3 cm Bosniak 1 cyst in the inferior pole of the right kidney, Hounsfield density 11. No follow-up imaging is recommended. The remainder of both kidneys enhance uniformly without mass enhancement bilaterally. There is no urinary stone or obstruction. There is no bladder thickening. Stomach/Bowel: As above, the upper 2/3 of the stomach is intrathoracic due to a large hiatal hernia, the stomach dilated with fluid both above and below the hernia mouth. There is an abrupt caliber change at the junction the collapsed antrum and dilated body of the stomach, which is just below the anterior lip of the diaphragmatic hernia and could be being compressed by the diaphragm. In any case there does appear to be impaired gastric emptying through this area. For reference, refer to coronal series 6 image 67, and sagittal series 7 images 56-60. The unopacified small bowel is normal caliber without inflammatory changes or overt wall thickening. The appendix is normal, retrocecal location. The large bowel is contracted for the most part. There is advanced sigmoid diverticulosis but no evidence of acute diverticulitis. Vascular/Lymphatic:  Aortic atherosclerosis. No enlarged abdominal or pelvic lymph nodes. Reproductive: 1.7 cm calcified fibroid noted over the right lateral body of uterus. The uterus is not enlarged nor otherwise unremarkable. The ovaries are normal in size. Other: No abdominal wall hernia, and no free fluid, free hemorrhage or free air are seen. Musculoskeletal: No acute or significant regional osseous findings. Review of the MIP images confirms the above findings. IMPRESSION: 1. No evidence of pulmonary arterial dilatation or embolus. 2. Aortic atherosclerosis. 3. Large hiatal hernia with upper 2/3 of the stomach intrathoracic, with the stomach dilated with fluid, and with an abrupt caliber change at the junction of the collapsed antrum and dilated body of the stomach. This junction is just below the anterior lip of the diaphragmatic hernia and could be being compressed by the diaphragm. In any case there does appear to be impaired gastric emptying through this area. 4. Fluid in the thoracic esophagus. Aspiration precautions recommended unless already being done. 5. 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 Nonemergent follow-up ultrasound is recommended. 6. 3.3 cm Bosniak 1 cyst inferior pole right kidney. No follow-up imaging recommended. 7. Advanced sigmoid diverticulosis without evidence of diverticulitis. 8. 1.7 cm calcified uterine fibroid. Aortic Atherosclerosis (ICD10-I70.0). Electronically Signed   By: Francis Quam M.D.   On: 09/18/2023 03:29   CT ABDOMEN PELVIS W CONTRAST Result Date: 09/18/2023 CLINICAL DATA:  Chest and abdomen pain. Bowel obstruction suspected. EXAM: CT ANGIOGRAPHY CHEST CT ABDOMEN AND PELVIS WITH CONTRAST TECHNIQUE: Multidetector CT imaging of the chest was performed using the standard protocol during bolus administration of intravenous contrast. Multiplanar CT image reconstructions and MIPs were obtained to evaluate  the vascular anatomy. Multidetector CT imaging of the abdomen and pelvis was performed using  the standard protocol during bolus administration of intravenous contrast. RADIATION DOSE REDUCTION: This exam was performed according to the departmental dose-optimization program which includes automated exposure control, adjustment of the mA and/or kV according to patient size and/or use of iterative reconstruction technique. CONTRAST:  75mL OMNIPAQUE  IOHEXOL  350 MG/ML SOLN COMPARISON:  Portable chest today is the only relevant prior. FINDINGS: CTA CHEST FINDINGS Cardiovascular: There is mild cardiomegaly. The dorsal walls of the left atrium and ventricle are mildly effaced due to a large hiatal hernia. There is no pericardial effusion no visible coronary calcification. Pulmonary arteries are normal caliber without reference are. There is tortuosity and scattered calcific plaques of the aorta. Minimal calcification in the great vessels. No aneurysm, stenosis or dissection. There is no venous dilatation. Mediastinum/Nodes: Large hiatal hernia with upper 2/3 of the femoral intrathoracic with the stomach dilated with fluid, scattered fluid in the thoracic esophagus. This places the patient at risk for aspiration. Aspiration precautions recommended unless already being done. There is a heterogeneous mass in the left thyroid lobe displacing the trachea to the right. It is not well seen due to metal artifact from an overlying necklace, but it measures at least 3.6 cm. Nonemergent follow-up ultrasound is recommended. The right lobe, both axillary spaces are unremarkable. No intrathoracic adenopathy is seen. Negative thoracic trachea. No esophageal wall thickening. Lungs/Pleura: There is compressive atelectasis in the lower lobes alongside the large hiatal hernia There are additional scattered linear scar-like opacities in the lung bases and mild posterior and lateral basal atelectasis. No infiltrate, nodule or effusion are seen. No pneumothorax. Limited fine detail due to respiratory motion. Musculoskeletal: There is  kyphosis and degenerative change of the thoracic spine. No spinal compression fracture. No acute or other significant osseous findings. Unremarkable chest wall. Review of the MIP images confirms the above findings. CT ABDOMEN and PELVIS FINDINGS Hepatobiliary: There is a calcified subcapsular granuloma inferiorly in segment 6. Remainder of the liver is unremarkable. Gallbladder is absent without biliary dilatation. Pancreas: No abnormality. Spleen: No abnormality.  No splenomegaly. Adrenals/Urinary Tract: There is no adrenal mass. There is a 3.3 cm Bosniak 1 cyst in the inferior pole of the right kidney, Hounsfield density 11. No follow-up imaging is recommended. The remainder of both kidneys enhance uniformly without mass enhancement bilaterally. There is no urinary stone or obstruction. There is no bladder thickening. Stomach/Bowel: As above, the upper 2/3 of the stomach is intrathoracic due to a large hiatal hernia, the stomach dilated with fluid both above and below the hernia mouth. There is an abrupt caliber change at the junction the collapsed antrum and dilated body of the stomach, which is just below the anterior lip of the diaphragmatic hernia and could be being compressed by the diaphragm. In any case there does appear to be impaired gastric emptying through this area. For reference, refer to coronal series 6 image 67, and sagittal series 7 images 56-60. The unopacified small bowel is normal caliber without inflammatory changes or overt wall thickening. The appendix is normal, retrocecal location. The large bowel is contracted for the most part. There is advanced sigmoid diverticulosis but no evidence of acute diverticulitis. Vascular/Lymphatic: Aortic atherosclerosis. No enlarged abdominal or pelvic lymph nodes. Reproductive: 1.7 cm calcified fibroid noted over the right lateral body of uterus. The uterus is not enlarged nor otherwise unremarkable. The ovaries are normal in size. Other: No abdominal wall  hernia, and no free  fluid, free hemorrhage or free air are seen. Musculoskeletal: No acute or significant regional osseous findings. Review of the MIP images confirms the above findings. IMPRESSION: 1. No evidence of pulmonary arterial dilatation or embolus. 2. Aortic atherosclerosis. 3. Large hiatal hernia with upper 2/3 of the stomach intrathoracic, with the stomach dilated with fluid, and with an abrupt caliber change at the junction of the collapsed antrum and dilated body of the stomach. This junction is just below the anterior lip of the diaphragmatic hernia and could be being compressed by the diaphragm. In any case there does appear to be impaired gastric emptying through this area. 4. Fluid in the thoracic esophagus. Aspiration precautions recommended unless already being done. 5. 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 Nonemergent follow-up ultrasound is recommended. 6. 3.3 cm Bosniak 1 cyst inferior pole right kidney. No follow-up imaging recommended. 7. Advanced sigmoid diverticulosis without evidence of diverticulitis. 8. 1.7 cm calcified uterine fibroid. Aortic Atherosclerosis (ICD10-I70.0). Electronically Signed   By: Francis Quam M.D.   On: 09/18/2023 03:29   DG Chest Port 1 View Result Date: 09/18/2023 CLINICAL DATA:  Chest pain EXAM: PORTABLE CHEST 1 VIEW COMPARISON:  None Available. FINDINGS: Large hiatal hernia. Heart and mediastinal contours within normal limits. Left basilar atelectasis adjacent to the hiatal hernia. No confluent opacity on the right. No effusions or edema. No acute bony abnormality. IMPRESSION: Large hiatal hernia. Compressive atelectasis in the adjacent left lower lobe. No active cardiopulmonary disease. Electronically Signed   By: Franky Crease M.D.   On: 09/18/2023 00:39        Scheduled Meds:  enoxaparin  (LOVENOX ) injection  40 mg Subcutaneous Q24H   Influenza vac split trivalent PF  0.5 mL Intramuscular Tomorrow-1000   pantoprazole  (PROTONIX ) IV  40 mg Intravenous  Q12H   pneumococcal 20-valent conjugate vaccine  0.5 mL Intramuscular Tomorrow-1000   sodium chloride  flush  3 mL Intravenous Q12H   Continuous Infusions:        Sophie Mao, MD [facility] 09/19/2023, 7:27 AM

## 2023-09-20 ENCOUNTER — Inpatient Hospital Stay (HOSPITAL_COMMUNITY)

## 2023-09-20 DIAGNOSIS — R112 Nausea with vomiting, unspecified: Secondary | ICD-10-CM | POA: Diagnosis not present

## 2023-09-20 LAB — BASIC METABOLIC PANEL WITH GFR
Anion gap: 12 (ref 5–15)
BUN: 16 mg/dL (ref 8–23)
CO2: 26 mmol/L (ref 22–32)
Calcium: 8.1 mg/dL — ABNORMAL LOW (ref 8.9–10.3)
Chloride: 101 mmol/L (ref 98–111)
Creatinine, Ser: 0.76 mg/dL (ref 0.44–1.00)
GFR, Estimated: >60 mL/min (ref >60)
Glucose, Bld: 102 mg/dL — ABNORMAL HIGH (ref 70–99)
Potassium: 3.2 mmol/L — ABNORMAL LOW (ref 3.5–5.1)
Sodium: 139 mmol/L (ref 135–145)

## 2023-09-20 LAB — CBC WITH DIFFERENTIAL/PLATELET
Abs Immature Granulocytes: 0.05 K/uL (ref 0.00–0.07)
Basophils Absolute: 0 K/uL (ref 0.0–0.1)
Basophils Relative: 0 %
Eosinophils Absolute: 0 K/uL (ref 0.0–0.5)
Eosinophils Relative: 0 %
HCT: 33.6 % — ABNORMAL LOW (ref 36.0–46.0)
Hemoglobin: 11.1 g/dL — ABNORMAL LOW (ref 12.0–15.0)
Immature Granulocytes: 0 %
Lymphocytes Relative: 11 %
Lymphs Abs: 1.3 K/uL (ref 0.7–4.0)
MCH: 30 pg (ref 26.0–34.0)
MCHC: 33 g/dL (ref 30.0–36.0)
MCV: 90.8 fL (ref 80.0–100.0)
Monocytes Absolute: 1.1 K/uL — ABNORMAL HIGH (ref 0.1–1.0)
Monocytes Relative: 10 %
Neutro Abs: 9 K/uL — ABNORMAL HIGH (ref 1.7–7.7)
Neutrophils Relative %: 79 %
Platelets: 284 K/uL (ref 150–400)
RBC: 3.7 MIL/uL — ABNORMAL LOW (ref 3.87–5.11)
RDW: 13.8 % (ref 11.5–15.5)
WBC: 11.5 K/uL — ABNORMAL HIGH (ref 4.0–10.5)
nRBC: 0 % (ref 0.0–0.2)

## 2023-09-20 LAB — CBC
HCT: 36.9 % (ref 36.0–46.0)
Hemoglobin: 12.1 g/dL (ref 12.0–15.0)
MCH: 30.3 pg (ref 26.0–34.0)
MCHC: 32.8 g/dL (ref 30.0–36.0)
MCV: 92.3 fL (ref 80.0–100.0)
Platelets: 254 K/uL (ref 150–400)
RBC: 4 MIL/uL (ref 3.87–5.11)
RDW: 13.8 % (ref 11.5–15.5)
WBC: 11.2 K/uL — ABNORMAL HIGH (ref 4.0–10.5)
nRBC: 0 % (ref 0.0–0.2)

## 2023-09-20 LAB — GLUCOSE, CAPILLARY
Glucose-Capillary: 69 mg/dL — ABNORMAL LOW (ref 70–99)
Glucose-Capillary: 79 mg/dL (ref 70–99)
Glucose-Capillary: 84 mg/dL (ref 70–99)

## 2023-09-20 LAB — MAGNESIUM: Magnesium: 1.9 mg/dL (ref 1.7–2.4)

## 2023-09-20 MED ORDER — METOPROLOL TARTRATE 5 MG/5ML IV SOLN: 5.0000 mg | INTRAVENOUS | Status: DC | PRN

## 2023-09-20 MED ORDER — GLUCAGON HCL RDNA (DIAGNOSTIC) 1 MG IJ SOLR
1.0000 mg | INTRAMUSCULAR | Status: DC | PRN
  Filled 2023-09-20: qty 1

## 2023-09-20 MED ORDER — POTASSIUM CHLORIDE 10 MEQ/100ML IV SOLN
10.0000 meq | INTRAVENOUS | Status: AC
  Administered 2023-09-20 (×5): 10 meq via INTRAVENOUS
  Filled 2023-09-20 (×6): qty 100

## 2023-09-20 MED ORDER — HYDRALAZINE HCL 20 MG/ML IJ SOLN: 10.0000 mg | INTRAMUSCULAR | Status: DC | PRN

## 2023-09-20 MED ORDER — IPRATROPIUM-ALBUTEROL 0.5-2.5 (3) MG/3ML IN SOLN: 3.0000 mL | RESPIRATORY_TRACT | Status: DC | PRN

## 2023-09-20 MED ORDER — PHENOL 1.4 % MT LIQD
1.0000 | OROMUCOSAL | Status: DC | PRN
  Administered 2023-09-20: 1 via OROMUCOSAL
  Filled 2023-09-20: qty 177

## 2023-09-20 NOTE — Hospital Course (Addendum)
 Brief Narrative:  73 y.o. female with medical history significant of hyperlipidemia, reported MI, prediabetes, asthma, iron deficiency anemia, and hiatal hernia presented with nausea, vomiting and abdominal pain.  On presentation, she was tachycardic, hypertensive with high-sensitivity troponins x 2 negative; chest x-ray showed large hiatal hernia with compressive atelectasis in the adjacent left lower lobe.  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  General surgery was consulted.  NG tube was placed.  She was started on IV fluids.    Assessment & Plan:   Intractable nausea vomiting Large hiatal hernia causing gastric outlet obstruction -General Surgery is following.  NG decompression.  Planning upper GI series - Accu-Cheks every 6 hours while NPO; IVF   Paroxysmal A-fib with RVR -Patient was briefly in A-fib with heart rates up to 183 on presentation.  Currently rate controlled.  Echo showed EF of 60 to 65%.  TSH normal   Hypokalemia -Replete as needed   Thyroid mass - Imaging showed 3.6 cm left thyroid mass.  TFTs normal.  Outpatient follow-up with endocrinology/PCP with nonemergent thyroid ultrasound     DVT prophylaxis: Lovenox  Code Status: Full Family Communication: None at bedside Disposition Plan: Status is: Inpatient Remains inpatient appropriate because: Of severity of illness   PT Follow up Recs:   Subjective: Seen at bedside, NG tube still in place. Going for upper GI series   Examination:  General exam: Appears calm and comfortable  Respiratory system: Clear to auscultation. Respiratory effort  normal. Cardiovascular system: S1 & S2 heard, RRR. No JVD, murmurs, rubs, gallops or clicks. No pedal edema. Gastrointestinal system: Abdomen is nondistended, soft and nontender. No organomegaly or masses felt. Normal bowel sounds heard. Central nervous system: Alert and oriented. No focal neurological deficits. Extremities: Symmetric 5 x 5 power. Skin: No rashes, lesions or ulcers Psychiatry: Judgement and insight appear normal. Mood & affect appropriate. NG tube in place

## 2023-09-20 NOTE — Progress Notes (Signed)
 Subjective/Chief Complaint: Comfortable Denies abdominal pain Had a bm   Objective: Vital signs in last 24 hours: Temp:  [98 F (36.7 C)-98.4 F (36.9 C)] 98.4 F (36.9 C) (09/09 2041) Pulse Rate:  [72-76] 72 (09/10 0803) Resp:  [15-18] 18 (09/10 0803) BP: (137-165)/(68-78) 165/78 (09/10 0803) SpO2:  [88 %-92 %] 88 % (09/10 0803) Last BM Date : 09/18/23  Intake/Output from previous day: 09/09 0701 - 09/10 0700 In: 1069.3 [I.V.:1069.3] Out: 550 [Emesis/NG output:550] Intake/Output this shift: No intake/output data recorded.  Exam: Comfortable in appearance NG in place Abdomen soft, Non-tender  Lab Results:  Recent Labs    09/19/23 0804 09/20/23 0304  WBC 11.9* 11.5*  HGB 12.9 11.1*  HCT 38.2 33.6*  PLT 306 284   BMET Recent Labs    09/19/23 0804 09/20/23 0304  NA 139 139  K 3.4* 3.2*  CL 101 101  CO2 24 26  GLUCOSE 124* 102*  BUN 13 16  CREATININE 0.95 0.76  CALCIUM  8.6* 8.1*   PT/INR No results for input(s): LABPROT, INR in the last 72 hours. ABG No results for input(s): PHART, HCO3 in the last 72 hours.  Invalid input(s): PCO2, PO2  Studies/Results: DG Abd 1 View Result Date: 09/20/2023 EXAM: 1 VIEW XRAY OF THE ABDOMEN 09/20/2023 02:08:00 AM COMPARISON: 09/18/2023 CLINICAL HISTORY: Encounter for NG tube present. FINDINGS: BOWEL: Nonobstructive bowel gas pattern. SOFT TISSUES: No opaque urinary calculi. Right upper quadrant surgical clips noted. BONES: No acute osseous abnormality. LINES AND TUBES: Enteric tube in place with tip and side port overlying the expected region of the gastric lumen. OTHER: Large lucency overlying the left hemidiaphragm consistent with large hiatal hernia. IMPRESSION: 1. Enteric tube tip and sideport in the stomach. 2. Large hiatal hernia. Electronically signed by: Norman Gatlin MD 09/20/2023 02:29 AM EDT RP Workstation: [id number]   DG Chest Port 1 View Result Date: 09/18/2023 EXAM: 1 VIEW XRAY OF THE  CHEST 09/18/2023 10:28:00 PM COMPARISON: 09/18/2023 CLINICAL HISTORY: SOB (shortness of breath) 141880. SOB FINDINGS: LUNGS AND PLEURA: Left basilar atelectasis. Right basilar linear atelectasis. No pleural effusion. No pneumothorax. HEART AND MEDIASTINUM: Large hiatal hernia. BONES AND SOFT TISSUES: No acute osseous abnormality. Atherosclerotic plaque noted. Enteric tube in place with tip overlying expected gastric lumen. IMPRESSION: 1. Bibasilar atelectasis. 2. Large hiatal hernia. Electronically signed by: Norman Gatlin MD 09/18/2023 10:37 PM EDT RP Workstation: [id number]   DG Abdomen 1 View Result Date: 09/18/2023 EXAM: 1 VIEW XRAY OF THE ABDOMEN 09/18/2023 12:35:23 PM COMPARISON: Abdominal radiograph dated 09/18/2023. CLINICAL HISTORY: NG tube placement. FINDINGS: BOWEL: Nonobstructive bowel gas pattern. SOFT TISSUES: No opaque urinary calculi. Surgical clips are present in the gallbladder fossa. BONES: No acute osseous abnormality. There is elevation of the left hemidiaphragm. LINES AND TUBES: A gastric tube remains with its tip along the greater curvature of the mid body of the stomach. IMPRESSION: 1. Gastric tube in place with its step along the greater curvature of the mid body of the stomach. 2. Elevation of the left hemidiaphragm. 3. Surgical clips in the gallbladder fossa. Electronically signed by: Evalene Coho MD 09/18/2023 12:52 PM EDT RP Workstation: [id number]    Anti-infectives: Anti-infectives (From admission, onward)    None       Assessment/Plan: Hiatal Hernia with gastric outlet obstruction  Abdominal xray shows NG in the stomach below the diaphragm.  Stomach still distended above. Will order an upper GI series with hopefully oral contrast to see how tight the obstruction is at the diaphragm.  Patient may require surgery this admission if not improving  Vicenta Poli 09/20/2023

## 2023-09-20 NOTE — Progress Notes (Signed)
 PROGRESS NOTE    Shelly Hammond  FMW:[id number] DOB: 1950/05/13 DOA: 09/17/2023 PCP: Trudy Mliss Dragon, MD    Brief Narrative:  73 y.o. female with medical history significant of hyperlipidemia, reported MI, prediabetes, asthma, iron deficiency anemia, and hiatal hernia presented with nausea, vomiting and abdominal pain.  On presentation, she was tachycardic, hypertensive with high-sensitivity troponins x 2 negative; chest x-ray showed large hiatal hernia with compressive atelectasis in the adjacent left lower lobe.  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  General surgery was consulted.  NG tube was placed.  She was started on IV fluids.    Assessment & Plan:   Intractable nausea vomiting Large hiatal hernia causing gastric outlet obstruction -General Surgery is following.  NG decompression.  Planning upper GI series - Accu-Cheks every 6 hours while NPO   Paroxysmal A-fib with RVR -Patient was briefly in A-fib with heart rates up to 183 on presentation.  Currently rate controlled.  Echo showed EF of 60 to 65%.  TSH normal   Hypokalemia -Replete as needed   Thyroid mass - Imaging showed 3.6 cm left thyroid mass.  TFTs normal.  Outpatient follow-up with endocrinology/PCP with nonemergent thyroid ultrasound     DVT prophylaxis: Lovenox  Code Status: Full Family Communication: None at bedside Disposition Plan: Status is: Inpatient Remains inpatient appropriate because: Of severity of illness   PT Follow up Recs:   Subjective: Seen at bedside, NG tube still in place.   Examination:  General exam: Appears calm and  comfortable  Respiratory system: Clear to auscultation. Respiratory effort normal. Cardiovascular system: S1 & S2 heard, RRR. No JVD, murmurs, rubs, gallops or clicks. No pedal edema. Gastrointestinal system: Abdomen is nondistended, soft and nontender. No organomegaly or masses felt. Normal bowel sounds heard. Central nervous system: Alert and oriented. No focal neurological deficits. Extremities: Symmetric 5 x 5 power. Skin: No rashes, lesions or ulcers Psychiatry: Judgement and insight appear normal. Mood & affect appropriate. NG tube in place               Diet Orders (From admission, onward)     Start     Ordered   09/18/23 0744  Diet NPO time specified  Diet effective now        09/18/23 0747            Objective: Vitals:   09/19/23 0806 09/19/23 1652 09/19/23 2041 09/20/23 0803  BP: (!) 147/76 137/68 139/70 (!) 165/78  Pulse: 72 73 76 72  Resp: 18 18 15 18   Temp:  98 F (36.7 C) 98.4 F (36.9 C)   TempSrc:   Oral   SpO2: 90% 92% 90% (!) 88%  Weight:      Height:        Intake/Output Summary (Last 24 hours) at 09/20/2023 1203 Last data filed at 09/20/2023 0600 Gross per 24 hour  Intake 1069.27 ml  Output 550 ml  Net 519.27 ml   Filed Weights   09/17/23 2334 09/18/23 1300  Weight: 63.5 kg 68.9 kg    Scheduled Meds:  enoxaparin  (LOVENOX ) injection  40 mg Subcutaneous Q24H  pantoprazole  (PROTONIX ) IV  40 mg Intravenous Q12H   sodium chloride  flush  3 mL Intravenous Q12H   Continuous Infusions:  sodium chloride  100 mL/hr at 09/20/23 0522   potassium chloride  10 mEq (09/20/23 1119)    Nutritional status     Body mass index is 26.91 kg/m.  Data Reviewed:   CBC: Recent Labs  Lab 09/18/23 0020 09/19/23 0804 09/20/23 0304 09/20/23 0935  WBC 9.8 11.9* 11.5* 11.2*  NEUTROABS  --   --  9.0*  --   HGB 13.0 12.9 11.1* 12.1  HCT 39.3 38.2 33.6* 36.9  MCV 91.0 89.5 90.8 92.3  PLT 295 306 284 254   Basic Metabolic Panel: Recent Labs   Lab 09/18/23 0020 09/18/23 0843 09/19/23 0804 09/20/23 0304  NA 140  --  139 139  K 3.3*  --  3.4* 3.2*  CL 103  --  101 101  CO2 24  --  24 26  GLUCOSE 150*  --  124* 102*  BUN 15  --  13 16  CREATININE 0.96  --  0.95 0.76  CALCIUM  8.8*  --  8.6* 8.1*  MG  --  1.9  --  1.9   GFR: Estimated Creatinine Clearance: 60.1 mL/min (by C-G formula based on SCr of 0.76 mg/dL). Liver Function Tests: Recent Labs  Lab 09/18/23 0020  AST 19  ALT 15  ALKPHOS 69  BILITOT 0.7  PROT 7.5  ALBUMIN 3.7   Recent Labs  Lab 09/18/23 0020  LIPASE 35   No results for input(s): AMMONIA in the last 168 hours. Coagulation Profile: No results for input(s): INR, PROTIME in the last 168 hours. Cardiac Enzymes: No results for input(s): CKTOTAL, CKMB, CKMBINDEX, TROPONINI in the last 168 hours. BNP (last 3 results) No results for input(s): PROBNP in the last 8760 hours. HbA1C: No results for input(s): HGBA1C in the last 72 hours. CBG: Recent Labs  Lab 09/20/23 1129  GLUCAP 84   Lipid Profile: No results for input(s): CHOL, HDL, LDLCALC, TRIG, CHOLHDL, LDLDIRECT in the last 72 hours. Thyroid Function Tests: Recent Labs    09/18/23 0843  TSH 0.630   Anemia Panel: No results for input(s): VITAMINB12, FOLATE, FERRITIN, TIBC, IRON, RETICCTPCT in the last 72 hours. Sepsis Labs: No results for input(s): PROCALCITON, LATICACIDVEN in the last 168 hours.  No results found for this or any previous visit (from the past 240 hours).       Radiology Studies: DG Abd 1 View Result Date: 09/20/2023 EXAM: 1 VIEW XRAY OF THE ABDOMEN 09/20/2023 02:08:00 AM COMPARISON: 09/18/2023 CLINICAL HISTORY: Encounter for NG tube present. FINDINGS: BOWEL: Nonobstructive bowel gas pattern. SOFT TISSUES: No opaque urinary calculi. Right upper quadrant surgical clips noted. BONES: No acute osseous abnormality. LINES AND TUBES: Enteric tube in place with tip and side  port overlying the expected region of the gastric lumen. OTHER: Large lucency overlying the left hemidiaphragm consistent with large hiatal hernia. IMPRESSION: 1. Enteric tube tip and sideport in the stomach. 2. Large hiatal hernia. Electronically signed by: Norman Gatlin MD 09/20/2023 02:29 AM EDT RP Workstation: [id number]   DG Chest Port 1 View Result Date: 09/18/2023 EXAM: 1 VIEW XRAY OF THE CHEST 09/18/2023 10:28:00 PM COMPARISON: 09/18/2023 CLINICAL HISTORY: SOB (shortness of breath) 141880. SOB FINDINGS: LUNGS AND PLEURA: Left basilar atelectasis. Right basilar linear atelectasis. No pleural effusion. No pneumothorax. HEART AND MEDIASTINUM: Large hiatal hernia. BONES AND SOFT TISSUES: No acute osseous abnormality. Atherosclerotic plaque noted. Enteric tube in place with tip  overlying expected gastric lumen. IMPRESSION: 1. Bibasilar atelectasis. 2. Large hiatal hernia. Electronically signed by: Norman Gatlin MD 09/18/2023 10:37 PM EDT RP Workstation: [id number]   DG Abdomen 1 View Result Date: 09/18/2023 EXAM: 1 VIEW XRAY OF THE ABDOMEN 09/18/2023 12:35:23 PM COMPARISON: Abdominal radiograph dated 09/18/2023. CLINICAL HISTORY: NG tube placement. FINDINGS: BOWEL: Nonobstructive bowel gas pattern. SOFT TISSUES: No opaque urinary calculi. Surgical clips are present in the gallbladder fossa. BONES: No acute osseous abnormality. There is elevation of the left hemidiaphragm. LINES AND TUBES: A gastric tube remains with its tip along the greater curvature of the mid body of the stomach. IMPRESSION: 1. Gastric tube in place with its step along the greater curvature of the mid body of the stomach. 2. Elevation of the left hemidiaphragm. 3. Surgical clips in the gallbladder fossa. Electronically signed by: Evalene Coho MD 09/18/2023 12:52 PM EDT RP Workstation: [id number]           LOS: 2 days   Time spent= 35 mins    Burgess JAYSON Dare, MD [facility]  If 7PM-7AM, please contact  night-coverage  09/20/2023, 12:03 PM

## 2023-09-21 ENCOUNTER — Inpatient Hospital Stay (HOSPITAL_COMMUNITY)

## 2023-09-21 ENCOUNTER — Other Ambulatory Visit: Payer: Self-pay

## 2023-09-21 DIAGNOSIS — R112 Nausea with vomiting, unspecified: Secondary | ICD-10-CM | POA: Diagnosis not present

## 2023-09-21 LAB — BASIC METABOLIC PANEL WITH GFR
Anion gap: 14 (ref 5–15)
BUN: 10 mg/dL (ref 8–23)
CO2: 18 mmol/L — ABNORMAL LOW (ref 22–32)
Calcium: 7.9 mg/dL — ABNORMAL LOW (ref 8.9–10.3)
Chloride: 105 mmol/L (ref 98–111)
Creatinine, Ser: 0.75 mg/dL (ref 0.44–1.00)
GFR, Estimated: >60 mL/min (ref >60)
Glucose, Bld: 84 mg/dL (ref 70–99)
Potassium: 3.3 mmol/L — ABNORMAL LOW (ref 3.5–5.1)
Sodium: 137 mmol/L (ref 135–145)

## 2023-09-21 LAB — GLUCOSE, CAPILLARY
Glucose-Capillary: 110 mg/dL — ABNORMAL HIGH (ref 70–99)
Glucose-Capillary: 155 mg/dL — ABNORMAL HIGH (ref 70–99)
Glucose-Capillary: 158 mg/dL — ABNORMAL HIGH (ref 70–99)
Glucose-Capillary: 161 mg/dL — ABNORMAL HIGH (ref 70–99)
Glucose-Capillary: 72 mg/dL (ref 70–99)
Glucose-Capillary: 81 mg/dL (ref 70–99)

## 2023-09-21 LAB — MAGNESIUM: Magnesium: 1.8 mg/dL (ref 1.7–2.4)

## 2023-09-21 LAB — PHOSPHORUS: Phosphorus: 2.6 mg/dL (ref 2.5–4.6)

## 2023-09-21 MED ORDER — TRAVASOL 10 % IV SOLN
INTRAVENOUS | Status: AC
  Filled 2023-09-21: qty 480

## 2023-09-21 MED ORDER — MAGNESIUM SULFATE 2 GM/50ML IV SOLN
2.0000 g | Freq: Once | INTRAVENOUS | Status: AC
  Administered 2023-09-21: 2 g via INTRAVENOUS
  Filled 2023-09-21: qty 50

## 2023-09-21 MED ORDER — MAGNESIUM SULFATE 2 GM/50ML IV SOLN
INTRAVENOUS | Status: AC
  Administered 2023-09-21: 2 g via INTRAVENOUS
  Filled 2023-09-21: qty 50

## 2023-09-21 MED ORDER — CHLORHEXIDINE GLUCONATE CLOTH 2 % EX PADS
6.0000 | MEDICATED_PAD | Freq: Every day | CUTANEOUS | Status: DC
  Administered 2023-09-21 – 2023-09-28 (×6): 6 via TOPICAL

## 2023-09-21 MED ORDER — SODIUM CHLORIDE 0.9% FLUSH
10.0000 mL | Freq: Two times a day (BID) | INTRAVENOUS | Status: DC
  Administered 2023-09-21 – 2023-09-27 (×8): 10 mL

## 2023-09-21 MED ORDER — DEXTROSE-SODIUM CHLORIDE 5-0.45 % IV SOLN: INTRAVENOUS | Status: DC

## 2023-09-21 MED ORDER — INSULIN ASPART 100 UNIT/ML IJ SOLN
0.0000 [IU] | INTRAMUSCULAR | Status: DC
  Administered 2023-09-21 – 2023-09-22 (×3): 1 [IU] via SUBCUTANEOUS

## 2023-09-21 MED ORDER — POTASSIUM CHLORIDE 10 MEQ/100ML IV SOLN
10.0000 meq | INTRAVENOUS | Status: AC
  Administered 2023-09-21 (×4): 10 meq via INTRAVENOUS
  Filled 2023-09-21 (×4): qty 100

## 2023-09-21 MED ORDER — DEXTROSE 50 % IV SOLN
50.0000 mL | INTRAVENOUS | Status: DC | PRN
  Administered 2023-09-21: 50 mL via INTRAVENOUS
  Filled 2023-09-21: qty 50

## 2023-09-21 MED ORDER — SODIUM CHLORIDE 0.9% FLUSH: 10.0000 mL | INTRAVENOUS | Status: DC | PRN

## 2023-09-21 MED ORDER — DEXTROSE-SODIUM CHLORIDE 5-0.45 % IV SOLN: INTRAVENOUS | Status: AC

## 2023-09-21 MED ORDER — DEXTROSE-SODIUM CHLORIDE 5-0.45 % IV SOLN
INTRAVENOUS | Status: AC
Start: 2023-09-21 — End: 2023-09-22

## 2023-09-21 MED ORDER — CARMEX CLASSIC LIP BALM EX OINT
TOPICAL_OINTMENT | CUTANEOUS | Status: DC | PRN
  Filled 2023-09-21: qty 10

## 2023-09-21 MED ORDER — TRAVASOL 10 % IV SOLN: INTRAVENOUS | Status: DC

## 2023-09-21 NOTE — Progress Notes (Signed)
 Subjective/Chief Complaint: Comfortable Denies abdominal pain Passing a little flatus Discussed UGI and likely need for surgery this admission but need to determine timing    Objective: Vital signs in last 24 hours: Temp:  [98.2 F (36.8 C)-99.3 F (37.4 C)] 98.2 F (36.8 C) (09/11 0826) Pulse Rate:  [62-65] 63 (09/11 0826) Resp:  [17-19] 18 (09/11 0826) BP: (133-158)/(55-71) 149/62 (09/11 0826) SpO2:  [92 %-95 %] 94 % (09/11 0826) Weight:  [69.2 kg] 69.2 kg (09/11 0500) Last BM Date : 09/21/23  Intake/Output from previous day: 09/10 0701 - 09/11 0700 In: 1622.4 [I.V.:1080.3; NG/GT:30; IV Piggyback:512.1] Out: 920 [Emesis/NG output:920] Intake/Output this shift: Total I/O In: 0  Out: 300 [Emesis/NG output:300]  Exam: Comfortable in appearance NG in place with minimal thin output  Abdomen soft, Non-tender  Lab Results:  Recent Labs    09/20/23 0304 09/20/23 0935  WBC 11.5* 11.2*  HGB 11.1* 12.1  HCT 33.6* 36.9  PLT 284 254   BMET Recent Labs    09/20/23 0304 09/21/23 0436  NA 139 137  K 3.2* 3.3*  CL 101 105  CO2 26 18*  GLUCOSE 102* 84  BUN 16 10  CREATININE 0.76 0.75  CALCIUM  8.1* 7.9*   PT/INR No results for input(s): LABPROT, INR in the last 72 hours. ABG No results for input(s): PHART, HCO3 in the last 72 hours.  Invalid input(s): PCO2, PO2  Studies/Results: US  EKG SITE RITE Result Date: 09/21/2023 If Site Rite image not attached, placement could not be confirmed due to current cardiac rhythm.  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 Radiology consulted for upper GI contrast study to evaluate severity of the obstruction. EXAM: WATER SOLUBLE UPPER GI SERIES TECHNIQUE: Single-column upper GI series was performed using water soluble contrast. This exam was performed by Kimble Clas, PA-C, and was supervised  and interpreted by Dr. Jennefer. Exam limited secondary to patient's known underlying large hiatal hernia, poor po intake ability and limited mobility. CONTRAST:  Omnipaque  300 COMPARISON:  None Available. FLUOROSCOPY: Radiation Exposure Index (if provided by the fluoroscopic device): 20.60 mGy Number of Acquired Spot Images: 4 FINDINGS: Large diaphragmatic hiatal hernia with proximal stomach filling with contrast and no visualized passing of contrast past the obstruction. NG tube visualized ending in the superior herniated part of the stomach. IMPRESSION: Limited study. Large diaphragmatic hiatal hernia. No visualized contrast passing the gastric obstruction. NG tube seen ending in the superior herniated stomach. Electronically Signed   By: Ester Jennefer M.D.   On: 09/21/2023 09:55   DG Abd 1 View Result Date: 09/20/2023 EXAM: 1 VIEW XRAY OF THE ABDOMEN 09/20/2023 02:08:00 AM COMPARISON: 09/18/2023 CLINICAL HISTORY: Encounter for NG tube present. FINDINGS: BOWEL: Nonobstructive bowel gas pattern. SOFT TISSUES: No opaque urinary calculi. Right upper quadrant surgical clips noted. BONES: No acute osseous abnormality. LINES AND TUBES: Enteric tube in place with tip and side port overlying the expected region of the gastric lumen. OTHER: Large lucency overlying the left hemidiaphragm consistent with large hiatal hernia. IMPRESSION: 1. Enteric tube tip and sideport in the stomach. 2. Large hiatal hernia. Electronically signed by: Norman Gatlin MD 09/20/2023 02:29 AM EDT RP Workstation: [id number]    Anti-infectives: Anti-infectives (From admission, onward)    None       Assessment/Plan: Hiatal Hernia with gastric outlet obstruction - CT 9/8 with large HH w/ upper 2/3 of stomach intrathoracic -  s/p NGT decompression without real improvement - UGI today without any visualized contrast passing through obstruction, NGT seen ending in herniated stomach - abdominal exam is benign  - Patient will need  surgical intervention for this during this admission - would be best served by proceeding next week for laparoscopic vs robotic repair if able - will need PICC and TPN in the meantime, I have ordered   FEN: NPO, NGT to LIWS, PICC and TPN ordered  VTE: LMWH ID: no current abx  - per [facility] -  CAD Pre-diabetes HLD Thyroid mass Paroxysmal A. Fib w/ RVR - now rate controlled   Shelly Hammond Louder, [facility] Surgery 09/21/2023, 11:20 AM Please see Amion for pager number during day hours 7:00am-4:30pm

## 2023-09-21 NOTE — Progress Notes (Signed)
 PROGRESS NOTE    Shelly Hammond  FMW:[id number] DOB: Dec 02, 1950 DOA: 09/17/2023 PCP: Trudy Mliss Dragon, MD    Brief Narrative:  73 y.o. female with medical history significant of hyperlipidemia, reported MI, prediabetes, asthma, iron deficiency anemia, and hiatal hernia presented with nausea, vomiting and abdominal pain.  On presentation, she was tachycardic, hypertensive with high-sensitivity troponins x 2 negative; chest x-ray showed large hiatal hernia with compressive atelectasis in the adjacent left lower lobe.  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  General surgery was consulted.  NG tube was placed.  She was started on IV fluids.    Assessment & Plan:   Intractable nausea vomiting Large hiatal hernia causing gastric outlet obstruction -General Surgery is following.  NG decompression.  Planning upper GI series - Accu-Cheks every 6 hours while NPO; IVF   Paroxysmal A-fib with RVR -Patient was briefly in A-fib with heart rates up to 183 on presentation.  Currently rate controlled.  Echo showed EF of 60 to 65%.  TSH normal   Hypokalemia -Replete as needed   Thyroid mass - Imaging showed 3.6 cm left thyroid mass.  TFTs normal.  Outpatient follow-up with endocrinology/PCP with nonemergent thyroid ultrasound     DVT prophylaxis: Lovenox  Code Status: Full Family Communication: None at bedside Disposition Plan: Status is: Inpatient Remains inpatient appropriate because: Of severity of illness   PT Follow up Recs:   Subjective: Seen at bedside, NG tube still in place. Going for upper GI series   Examination:  General  exam: Appears calm and comfortable  Respiratory system: Clear to auscultation. Respiratory effort normal. Cardiovascular system: S1 & S2 heard, RRR. No JVD, murmurs, rubs, gallops or clicks. No pedal edema. Gastrointestinal system: Abdomen is nondistended, soft and nontender. No organomegaly or masses felt. Normal bowel sounds heard. Central nervous system: Alert and oriented. No focal neurological deficits. Extremities: Symmetric 5 x 5 power. Skin: No rashes, lesions or ulcers Psychiatry: Judgement and insight appear normal. Mood & affect appropriate. NG tube in place               Diet Orders (From admission, onward)     Start     Ordered   09/18/23 0744  Diet NPO time specified  Diet effective now        09/18/23 0747            Objective: Vitals:   09/20/23 2016 09/21/23 0415 09/21/23 0500 09/21/23 0826  BP: (!) 157/68 (!) 133/55  (!) 149/62  Pulse: 62 62  63  Resp: 17 19  18   Temp: 99 F (37.2 C) 99.3 F (37.4 C)  98.2 F (36.8 C)  TempSrc: Oral Oral  Oral  SpO2: 95% 94%  94%  Weight:   69.2 kg   Height:        Intake/Output Summary (Last 24 hours) at 09/21/2023 0948 Last data filed at 09/21/2023 0825 Gross per 24 hour  Intake 1622.43 ml  Output 1220 ml  Net 402.43 ml   Filed Weights   09/17/23 2334 09/18/23 1300 09/21/23 0500  Weight: 63.5 kg 68.9 kg 69.2 kg  Scheduled Meds:  enoxaparin  (LOVENOX ) injection  40 mg Subcutaneous Q24H   pantoprazole  (PROTONIX ) IV  40 mg Intravenous Q12H   sodium chloride  flush  3 mL Intravenous Q12H   Continuous Infusions:  dextrose  5 % and 0.45 % NaCl     magnesium  sulfate     potassium chloride  10 mEq (09/21/23 0901)    Nutritional status     Body mass index is 27.03 kg/m.  Data Reviewed:   CBC: Recent Labs  Lab 09/18/23 0020 09/19/23 0804 09/20/23 0304 09/20/23 0935  WBC 9.8 11.9* 11.5* 11.2*  NEUTROABS  --   --  9.0*  --   HGB 13.0 12.9 11.1* 12.1  HCT 39.3 38.2 33.6* 36.9  MCV 91.0 89.5  90.8 92.3  PLT 295 306 284 254   Basic Metabolic Panel: Recent Labs  Lab 09/18/23 0020 09/18/23 0843 09/19/23 0804 09/20/23 0304 09/21/23 0436  NA 140  --  139 139 137  K 3.3*  --  3.4* 3.2* 3.3*  CL 103  --  101 101 105  CO2 24  --  24 26 18*  GLUCOSE 150*  --  124* 102* 84  BUN 15  --  13 16 10   CREATININE 0.96  --  0.95 0.76 0.75  CALCIUM  8.8*  --  8.6* 8.1* 7.9*  MG  --  1.9  --  1.9 1.8  PHOS  --   --   --   --  2.6   GFR: Estimated Creatinine Clearance: 60.2 mL/min (by C-G formula based on SCr of 0.75 mg/dL). Liver Function Tests: Recent Labs  Lab 09/18/23 0020  AST 19  ALT 15  ALKPHOS 69  BILITOT 0.7  PROT 7.5  ALBUMIN 3.7   Recent Labs  Lab 09/18/23 0020  LIPASE 35   No results for input(s): AMMONIA in the last 168 hours. Coagulation Profile: No results for input(s): INR, PROTIME in the last 168 hours. Cardiac Enzymes: No results for input(s): CKTOTAL, CKMB, CKMBINDEX, TROPONINI in the last 168 hours. BNP (last 3 results) No results for input(s): PROBNP in the last 8760 hours. HbA1C: No results for input(s): HGBA1C in the last 72 hours. CBG: Recent Labs  Lab 09/20/23 1129 09/20/23 1658 09/20/23 2354 09/21/23 0207 09/21/23 0609  GLUCAP 84 79 69* 158* 72   Lipid Profile: No results for input(s): CHOL, HDL, LDLCALC, TRIG, CHOLHDL, LDLDIRECT in the last 72 hours. Thyroid Function Tests: No results for input(s): TSH, T4TOTAL, FREET4, T3FREE, THYROIDAB in the last 72 hours. Anemia Panel: No results for input(s): VITAMINB12, FOLATE, FERRITIN, TIBC, IRON, RETICCTPCT in the last 72 hours. Sepsis Labs: No results for input(s): PROCALCITON, LATICACIDVEN in the last 168 hours.  No results found for this or any previous visit (from the past 240 hours).       Radiology Studies: DG Abd 1 View Result Date: 09/20/2023 EXAM: 1 VIEW XRAY OF THE ABDOMEN 09/20/2023 02:08:00 AM COMPARISON: 09/18/2023  CLINICAL HISTORY: Encounter for NG tube present. FINDINGS: BOWEL: Nonobstructive bowel gas pattern. SOFT TISSUES: No opaque urinary calculi. Right upper quadrant surgical clips noted. BONES: No acute osseous abnormality. LINES AND TUBES: Enteric tube in place with tip and side port overlying the expected region of the gastric lumen. OTHER: Large lucency overlying the left hemidiaphragm consistent with large hiatal hernia. IMPRESSION: 1. Enteric tube tip and sideport in the stomach. 2. Large hiatal hernia. Electronically signed by: Norman Gatlin MD 09/20/2023 02:29 AM EDT RP Workstation: [id number]  LOS: 3 days   Time spent= 35 mins    Burgess JAYSON Dare, MD [facility]  If 7PM-7AM, please contact night-coverage  09/21/2023, 9:48 AM

## 2023-09-21 NOTE — Progress Notes (Signed)
 Peripherally Inserted Central Catheter Placement  The IV Nurse has discussed with the patient and/or persons authorized to consent for the patient, the purpose of this procedure and the potential benefits and risks involved with this procedure.  The benefits include less needle sticks, lab draws from the catheter, and the patient may be discharged home with the catheter. Risks include, but not limited to, infection, bleeding, blood clot (thrombus formation), and puncture of an artery; nerve damage and irregular heartbeat and possibility to perform a PICC exchange if needed/ordered by physician.  Alternatives to this procedure were also discussed.  Bard Power PICC patient education guide, fact sheet on infection prevention and patient information card has been provided to patient /or left at bedside.    PICC Placement Documentation  PICC Double Lumen 09/21/23 Right Brachial 38 cm 0 cm (Active)  Indication for Insertion or Continuance of Line Administration of hyperosmolar/irritating solutions (i.e. TPN, Vancomycin, etc.) 09/21/23 1518  Exposed Catheter (cm) 0 cm 09/21/23 1518  Site Assessment Clean, Dry, Intact 09/21/23 1518  Lumen #1 Status Flushed;Saline locked;Blood return noted 09/21/23 1518  Lumen #2 Status Flushed;Saline locked;Blood return noted 09/21/23 1518  Dressing Type Transparent;Securing device 09/21/23 1518  Dressing Status Antimicrobial disc/dressing in place;Clean, Dry, Intact 09/21/23 1518  Line Care Connections checked and tightened 09/21/23 1518  Line Adjustment (NICU/IV Team Only) No 09/21/23 1518  Dressing Intervention New dressing;Adhesive placed at insertion site (IV team only) 09/21/23 1518  Dressing Change Due 09/28/23 09/21/23 1518       Renaee Notice Albarece 09/21/2023, 3:19 PM

## 2023-09-21 NOTE — Progress Notes (Signed)
 PHARMACY - TOTAL PARENTERAL NUTRITION CONSULT NOTE   Indication: intolerance to TF w/ gastric outlet obstruction requiring surgical intervention  Patient Measurements: Height: 5' 3 (160 cm) Weight: 69.2 kg (152 lb 9.6 oz) IBW/kg (Calculated) : 52.4 TPN AdjBW (KG): 56.5 Body mass index is 27.03 kg/m. Usual Weight: 68 kg. Patient states she has not lost any weight in the most recent 3 months time frame.  Assessment:  66 YOF admitted with a PMH significant for hiatal hernia w/ gastric outlet obstruction requiring surgical intervention. She has had N/V since Saturday. The vomiting was accompanied by upper abdominal pain. Emesis was brown in color with no evidence of blood. She has began having nausea since early 2024 when eating which prompted a visit to PCP resulting in a referral to GI who discovered a twisted colon identified on prior colonoscopy. Most recent food intake was noted as rice pilaf with a spice packet. Consult for TPN has been received  Glucose / Insulin : CBG's 69-158 w/ hx of pre-DM. No PTA meds outside of a MV noted. Electrolytes: Na 137, Cl 105, K 3.3, Ca 7.9 [CoCa ], Phos 2.6, mag 1.8 Renal: scr 0.75 / BUN 10 Hepatic: Alk  Phos / AST / ALT / Tbili wnl, Albumin 3.7 (Hepatic panel 09/18/2023) Intake / Output; MIVF: mIVF D5 1/2 NS 100 ml/hr ending 9/12 0845 / UOP 11+ unmeasured, 300 mL emesis GI Imaging: 9/8 CT abd large hiatal hernia upper 2/3 stomach intrathoracic, stomach dilated w/ fluid and abrupt caliber change at the junction of the collapsed antrum and dilated body of the stomach. Appears to be impaired gastric emptying  GI Surgeries / Procedures:    Central access: 9/11 TPN start date: 9/11  Nutritional Goals: Goal TPN rate is 80 mL/hr (provides 90 g of protein and 1861 kcals per day)  RD Assessment:   Kcals 1700-1900 kcals Protein: 80-95 g Fluid: 1.7 L to 1.9 L   Current Nutrition:  NPO and TPN  Plan:  Start TPN at 40mL/hr at 1800 (provides 48 g AA and  992 Kcal per day) Electrolytes in TPN: Na 94 mEq/L, K 80 mEq/L, Ca  10 mEq/L, Mg 10 mEq/L, and Phos 25 mmol/L. Cl:Ac 1:1 Add standard MVI and trace elements to TPN Initiate Sensitive q4h SSI and adjust as needed starting 2200 on 9/11 Reduce MIVF to 60 mL/hr at 1800 (ending 9/12 0845) Monitor TPN labs on Mon/Thurs, daily until stable, then per protocol   Benedetta Heath BS, PharmD, BCPS Clinical Pharmacist 09/21/2023 12:40 PM  Contact: [phone] after 3 PM

## 2023-09-22 LAB — COMPREHENSIVE METABOLIC PANEL WITH GFR
ALT: 14 U/L (ref 0–44)
AST: 19 U/L (ref 15–41)
Albumin: 2.6 g/dL — ABNORMAL LOW (ref 3.5–5.0)
Alkaline Phosphatase: 46 U/L (ref 38–126)
Anion gap: 11 (ref 5–15)
BUN: 5 mg/dL — ABNORMAL LOW (ref 8–23)
CO2: 27 mmol/L (ref 22–32)
Calcium: 8 mg/dL — ABNORMAL LOW (ref 8.9–10.3)
Chloride: 101 mmol/L (ref 98–111)
Creatinine, Ser: 0.63 mg/dL (ref 0.44–1.00)
GFR, Estimated: >60 mL/min (ref >60)
Glucose, Bld: 143 mg/dL — ABNORMAL HIGH (ref 70–99)
Potassium: 3.3 mmol/L — ABNORMAL LOW (ref 3.5–5.1)
Sodium: 139 mmol/L (ref 135–145)
Total Bilirubin: 0.6 mg/dL (ref 0.0–1.2)
Total Protein: 6.1 g/dL — ABNORMAL LOW (ref 6.5–8.1)

## 2023-09-22 LAB — GLUCOSE, CAPILLARY
Glucose-Capillary: 149 mg/dL — ABNORMAL HIGH (ref 70–99)
Glucose-Capillary: 151 mg/dL — ABNORMAL HIGH (ref 70–99)
Glucose-Capillary: 123 mg/dL — ABNORMAL HIGH (ref 70–99)
Glucose-Capillary: 128 mg/dL — ABNORMAL HIGH (ref 70–99)
Glucose-Capillary: 138 mg/dL — ABNORMAL HIGH (ref 70–99)

## 2023-09-22 LAB — PHOSPHORUS: Phosphorus: 3 mg/dL (ref 2.5–4.6)

## 2023-09-22 LAB — MAGNESIUM: Magnesium: 2 mg/dL (ref 1.7–2.4)

## 2023-09-22 MED ORDER — POTASSIUM CHLORIDE 10 MEQ/50ML IV SOLN
10.0000 meq | INTRAVENOUS | Status: AC
  Administered 2023-09-22 (×6): 10 meq via INTRAVENOUS
  Filled 2023-09-22 (×6): qty 50

## 2023-09-22 MED ORDER — TRAVASOL 10 % IV SOLN
INTRAVENOUS | Status: AC
  Filled 2023-09-22: qty 720

## 2023-09-22 MED ORDER — IOHEXOL 300 MG/ML  SOLN
100.0000 mL | Freq: Once | INTRAMUSCULAR | Status: AC | PRN
  Administered 2023-09-21: 100 mL via ORAL

## 2023-09-22 NOTE — Plan of Care (Signed)

## 2023-09-22 NOTE — Progress Notes (Signed)
 PHARMACY - TOTAL PARENTERAL NUTRITION CONSULT NOTE  Indication: intolerance to TF w/ gastric outlet obstruction requiring surgical intervention  Patient Measurements: Height: 5' 3 (160 cm) Weight: 66.7 kg (147 lb 1.6 oz) IBW/kg (Calculated) : 52.4 TPN AdjBW (KG): 56.5 Body mass index is 26.06 kg/m. Usual Weight: 68 kg. Patient states she has not lost any weight in the most recent 3 months time frame.  Assessment:  67 YOF admitted with a PMH significant for hiatal hernia with GOO requiring surgical intervention. She has had N/V since Saturday. The vomiting was accompanied by upper abdominal pain. Emesis was brown in color with no evidence of blood. She has began having nausea since early 2024 when eating which prompted a visit to PCP resulting in a referral to GI who discovered a twisted colon identified on prior colonoscopy. Most recent food intake was noted as rice pilaf with a spice packet. Consult for TPN has been received.  Glucose / Insulin : hx preDM -  Electrolytes: Na 139, Cl 101, K 3.3, Ca 8.0 [CoCa 9.12], Phos 3.0, mag 2.0 Renal: SCr < 1, BUN WNL Hepatic: LFTs / tbili WNL, albumin 2.6 Intake / Output; MIVF: UOP 5+ unmeasured, NG: 700 mL, LBM 9/11 GI Imaging: 9/8 CT: large hiatal hernia upper 2/3 stomach intrathoracic, appears to be impaired gastric emptying GI Surgeries / Procedures:  none  Central access: PICC placed 09/21/23 TPN start date: 09/21/23  Nutritional Goals: Goal TPN rate is 80 mL/hr (= 90g AA and 1861 kcals per day)  RD Estimated Needs Total Energy Estimated Needs: 1700-1900 kcals Total Protein Estimated Needs: 80-95g Total Fluid Estimated Needs: 1.7-1.9L/day  Current Nutrition:  TPN  Plan:  Advance TPN to 55mL/hr at 1800 (provides 48 g AA and 992 Kcal per day) Electrolytes in TPN: Na 65 mEq/L, K 70 mEq/L, Ca  7 mEq/L, Mg 7 mEq/L, Phos 20 mmol/L. Cl:Ac 2:1 Add standard MVI and trace elements to TPN Continue very sensitive SSI Q4H K runs iv x6   Monitor TPN labs on Mon/Thurs - labs in AM

## 2023-09-22 NOTE — Progress Notes (Signed)
 PHARMACY - TOTAL PARENTERAL NUTRITION CONSULT NOTE   Indication: intolerance to TF w/ gastric outlet obstruction requiring surgical intervention  Patient Measurements: Height: 5' 3 (160 cm) Weight: 69.2 kg (152 lb 9.6 oz) IBW/kg (Calculated) : 52.4 TPN AdjBW (KG): 56.5 Body mass index is 27.03 kg/m. Usual Weight: 68 kg. Patient states she has not lost any weight in the most recent 3 months time frame.  Assessment:  6 YOF admitted with a PMH significant for hiatal hernia w/ gastric outlet obstruction requiring surgical intervention. She has had N/V since Saturday. The vomiting was accompanied by upper abdominal pain. Emesis was brown in color with no evidence of blood. She has began having nausea since early 2024 when eating which prompted a visit to PCP resulting in a referral to GI who discovered a twisted colon identified on prior colonoscopy. Most recent food intake was noted as rice pilaf with a spice packet. Consult for TPN has been received  Glucose / Insulin : CBG's 81-155 w/ hx of pre-DM. No PTA meds outside of a MV noted. 2 units SSi given in the past 24 hours Electrolytes: Na 139, Cl 101, K 3.3, Ca 8.0 [CoCa 9.12], Phos 3.0, mag 2.0 Renal: scr 0.63 / BUN 5 Hepatic: Alk  Phos / AST / ALT / Tbili wnl, Albumin 2.6  Intake / Output; MIVF: UOP 5+ unmeasured, NG: 700 mL, LBM 9/11 GI Imaging: 9/8 CT abd large hiatal hernia upper 2/3 stomach intrathoracic, stomach dilated w/ fluid and abrupt caliber change at the junction of the collapsed antrum and dilated body of the stomach. Appears to be impaired gastric emptying  GI Surgeries / Procedures:  none  Central access: 9/11 TPN start date: 9/11  Nutritional Goals: Goal TPN rate is 80 mL/hr (provides 90 g of protein and 1861 kcals per day)  RD Assessment:   Kcals 1700-1900 kcals Protein: 80-95 g Fluid: 1.7 L to 1.9 L   Current Nutrition:  NPO and TPN  Plan:  Advance TPN to 8mL/hr at 1800 (provides 48 g AA and 992 Kcal per  day) Electrolytes in TPN: Na 65 mEq/L, K 70 mEq/L, Ca  7 mEq/L, Mg 7 mEq/L, and Phos 20 mmol/L. Cl:Ac 2:1 Add standard MVI and trace elements to TPN Initiate Sensitive q4h SSI and adjust as needed starting 2200 on 9/11 K runs iv x6  Monitor TPN labs on Mon/Thurs, daily until stable, then per protocol   Benedetta Heath BS, PharmD, BCPS Clinical Pharmacist 09/22/2023 7:00 AM  Contact: [phone] after 3 PM

## 2023-09-22 NOTE — Care Management Important Message (Signed)
 Important Message  Patient Details  Name: Shelly Hammond MRN: [id number] Date of Birth: 1950-06-10   Important Message Given:  Yes - Medicare IM     Claretta Deed 09/22/2023, 8:33 AM

## 2023-09-22 NOTE — Progress Notes (Signed)
 Patient is to likely require surgical intervention due to hiatal hernia with gastric outlet obstruction. Discussed with general surgery who will assume the primary role.  Appreciate their help.  Please reach out to medicine team if necessary.  Burgess Dare MD [facility]

## 2023-09-22 NOTE — Progress Notes (Addendum)
 Initial Nutrition Assessment  DOCUMENTATION CODES:   Not applicable  INTERVENTION:  Parenteral Nutrition - titrate to meet estimated nutrition needs.  Kcal: 1700-1900 kcals Protein: 80-95g    Add MVI w/ trace elements  Monitor for ability to advance oral diet or indication for enteral nutrition  Transition to oral MVI when TPN d/c'ed  When advanced to oral diet, recommend ONS to augment intake  NUTRITION DIAGNOSIS:  Inadequate oral intake related to inability to eat as evidenced by NPO status.  GOAL:  Patient will meet greater than or equal to 90% of their needs  MONITOR:  Skin, I & O's, Diet advancement, Weight trends, Labs, PO intake  REASON FOR ASSESSMENT:  Consult New TPN/TNA  ASSESSMENT:   Pt with PMH significant for: HLD, asthma, anemia, heart attack and hiatal hernia. Admitted with nausea/vomiting.abdominal pain. Found to have gastric outlet obstruction requiring surgical intervention.   9/07 - admitted 9/08 - CT: large hiatal hernia w/ upper 2/3 of stomach intrathoracic  9/10 - NGT placed for decompression 9/11 - UGI: without visualized contrast passing through obstruction, TPN initiated  Patient resting in bed at time of bedside visit. Difficult to redirect at times. Pt started on TPN yesterday, per surgery, and up to goal rate today. NGT remain in place. Had been used for decompression without real improvement.   24 Hour Recall B: eggs, English muffin w/ jam L: deli sandwich D: protein, starch, veg  Patient endorses normal and appropriate intake prior to admission. No issues chewing or swallowing, despite not frequently wearing dentures. Reports they were uncomfortable. This does not inhibit her intake significantly. States he cannot eat very hard foods, like nuts, but tolerates everything else. She only tried ONS for one week years ago when she was sick, but does not currently consume consistently. Discussed the potential need for this when oral diet  advanced. She verbalized understanding.  Admit Weight: 63.5 kg Current Weight: 66.7 kg  No significant edema on exam. She reports UBW around 147-148 lbs, which is around her current body weight. Per chart review, no recent weight hx to review. She reports no significant weight changes in last year. No skin breakdown.  Drains/Lines: NGT: gastric (placed 9/10) L cephalic: midline (placed 9/11) R brachial: PICC (placed 9/11)  Low risk for refeeding as she endorses adequate appetite PTA and well nourished on exam. Potassium low today and being repleted. PHOS/Magnesium  stable. WBC remain elevated, yet stable.  Meds: SS novolog  0-6 q4h, pantoprazole ,  Drips: 10 mEq KCl x6  Labs: Na+ 139 (wdl) K+ 3.2>3.3>3.3 (L) PHOS 3.0 (wdl) Mg 2.0 (wdl) WBC 11.9>11.5>11.2 (9/10) CBGs 84-143 x24 hours  NUTRITION - FOCUSED PHYSICAL EXAM:  Per NFPE, she does not currently meet criteria for malnutrition.   Flowsheet Row Most Recent Value  Orbital Region No depletion  Upper Arm Region Mild depletion  Thoracic and Lumbar Region No depletion  Buccal Region No depletion  Temple Region No depletion  Clavicle Bone Region No depletion  Clavicle and Acromion Bone Region No depletion  Scapular Bone Region Mild depletion  Dorsal Hand No depletion  Patellar Region No depletion  Anterior Thigh Region No depletion  Posterior Calf Region No depletion  Edema (RD Assessment) None  Hair Reviewed  Eyes Reviewed  Mouth Reviewed  [edentulous]  Skin Reviewed  Nails Reviewed    Diet Order:   Diet Order             Diet NPO time specified  Diet effective now  EDUCATION NEEDS:   No education needs have been identified at this time  Skin:  Skin Assessment: Reviewed RN Assessment  Last BM:  9/11 - type 6 x1  Height:  Ht Readings from Last 1 Encounters:  09/18/23 5' 3 (1.6 m)   Weight:  Wt Readings from Last 1 Encounters:  09/22/23 66.7 kg    Ideal Body Weight:  52.3 kg  BMI:   Body mass index is 26.06 kg/m.  Estimated Nutritional Needs:   Kcal:  1700-1900 kcals  Protein:  80-95g  Fluid:  1.7-1.9L/day  Blair Deaner MS, RD, LDN Registered Dietitian Clinical Nutrition RD Inpatient Contact Info in Amion

## 2023-09-22 NOTE — Discharge Instructions (Addendum)
 General, Healthful Nutrition Therapy  This handout provides you with the information you'll need to follow a general, healthful diet, which can be tailored to your personal preferences. There are several benefits to following a general, healthful diet: It could mean less calories, less salt, less added sugars, and less saturated fat than many other diets. This outcome will depend on the foods you choose. Eating more whole grains, beans, lentils, fruits, vegetables, nuts, and seeds may improve how much fiber, vitamins, and minerals you eat.  It can lower your risk for health conditions like diabetes, heart disease, hypertension, stroke, and cancer. Your registered dietitian nutritionist (RDN) may recommend portion sizes based on your individual needs and personal and cultural preferences.  Tips Every day, eat a variety of fruits and vegetables in a variety of colors. Be sure to include lots of dark green, red, blue-purple, and orange vegetables. Choose whole grains for at least half of your grain selections. Eat more beans, peas, and lentils. Try meatless alternatives. Get protein in your diet from eggs, fish, poultry, beans, peas, lentils, and nuts/nut butters. Low-fat or fat-free dairy products are also good sources of protein. Keep your salt intake to a minimum (less than 2300 milligrams per day). Limit use of salt, soy sauce, or fish sauce when cooking. Eat freshly prepared meals at home. Processed, prepackaged, and restaurant foods contain more salt. Choose fresh fruits and vegetables for snacks. Choose products with lower sodium content when grocery shopping. Limit your daily sugar intake. Sugar may be used in sauces, marinades, dressings, and condiments - even those that do not taste sweet.  Sugar can be found in honey, syrups, jelly, fruit juice, and fruit juice concentrate. Limit sugar-sweetened beverages like sodas and fruit juice, sugary snacks, and candy. It's best to choose  products without added sugar, but if you do eat them, read labels carefully so you know how much sugar is in each portion. It is better to eat unsaturated fats than saturated fats. Use fats and oils in moderation, up to 5 servings per day. Unsaturated fat is found in fish, avocado, nuts, and oils like sunflower, canola, avocado and olive oils. Saturated fat is found in fatty meat, butter, ice cream, palm and coconut oil, cream, cheese, and lard. Many processed foods, fried foods, fast food items, convenience foods like frozen pizza and snack foods, and sweets including pies, cookies, and other pastries are high in fat. Check nutrition labels and choose these foods less often. Use vegetable oil instead of lard or butter for cooking. Boil, steam, or bake your food instead of deep frying in oil. Remove the fatty part of meats before cooking.  Foods to Choose or to Limit Choose a healthful balance of foods from each food group at your meals. Your RDN may make individualized portion size recommendations based on your needs. Food Group Foods to Choose Foods to Limit  Grains Whole wheat, barley, rye, buckwheat, corn, teff, quinoa, millet, amaranth, brown and wild rice, sorghum, and oats; focus on intact cooked whole grains Grain products, such as bread, rolls, prepared breakfast cereals, crackers, and pasta made from whole grains that are low in added sugars, saturated fat, and sodium Sweetened, low-fiber breakfast cereals Packaged (high sugar, refined ingredients) baked goods Snack crackers and chips made of refined ingredients, cheese crackers, butter crackers Breads made with refined ingredients and saturated fats, such as biscuits, frozen waffles, sweet breads, doughnuts, pastries, packaged baking mixes, pancakes, cakes, and cookies  Protein Foods Meat, including lean, trimmed cuts of  beef, pork, or lamb a few times per week or less Poultry, including skinless chicken or [place] Seafood, including  fish, shrimp, lobster, clams, and scallops at least twice per week. Focus on fatty fish, such as salmon, herring, and sardines, as a rich source of omega-3 fatty acids Eggs Nuts and seeds, such as peanuts, almonds, pistachios, and sunflower seeds (unsalted varieties) Nut and seed butters, such as peanut butter, almond butter, and sunflower seed butter (reduced-sodium varieties)  Soy foods such as tofu, tempeh, or soy nuts Plant protein-based meat alternatives, such as veggie burgers and sausages (reduced-sodium varieties) Unsalted beans, lentils, or peas at least a few times per week in place of other protein sources Marbled or fatty red meats (beef, pork, lamb), such as ribs Processed red meats, such as bacon, sausage, and ham Poultry (chicken and [place]) with skin Fried meats, poultry, or fish [employer], shark, and Elliott (may contain high levels of mercury) Deli meats, such as pastrami, bologna, or salami Fried eggs Salted beans, peas, lentils, nuts, seeds, or nut/seed butters Meat alternatives with high levels of sodium or saturated fat  Dairy and Dairy Alternatives Low-fat or fat-free milk, yogurt (low in added sugars), cottage cheese, and cheeses Fortified soymilk or soy yogurt Whole milk, cream, cheeses made from whole milk, sour cream Yogurt or ice cream made from whole milk or with added sugar Cream cheese made from whole milk  Vegetables A variety of vegetables, including dark-green, red, blue-purple, and orange vegetables Low-sodium vegetable juices Canned or frozen vegetables with salt, fresh vegetables prepared with salt Fried vegetables Vegetables in cream sauce or cheese sauce Tomato or pasta sauce with high levels of salt or sugar  Fruit A variety of whole fruits, canned fruit packed in water , or dried fruit 100% fruit juice (limited to  cup per day) Fruits packed in syrup or made with added sugar  Fats and Oils Unsaturated vegetable oils, including olive, peanut, and  canola oils Vegetable oil-based margarines and spreads Salad dressing and mayonnaise made from unsaturated vegetable oils Solid shortening or partially hydrogenated oils Solid margarine made with hydrogenated or partially hydrogenated oils Butter  Beverages Coffee and tea (unsweetened) Water  Sweetened drinks, including sweetened coffee or tea drinks, soda, energy drinks, and sports drinks  Other Prepared foods, including soups, casseroles, salads, baked goods, and snacks made from recommended ingredients, with low levels of added saturated fat, added sugars, or salt Sugary and/or fatty desserts, candy, and other sweets; salt and seasonings that contain salt Fried foods   General, Healthful Diet Sample 1-Day Menu View Nutrient Info Breakfast 1 cup oatmeal  cup blueberries 1 ounce almonds 1 cup 1% milk or fortified soymilk 1 cup unsweetened coffee  Lunch 2 slices whole wheat bread 3 ounces [place] slices 2 lettuce leaves 2 slices tomato 1 ounce reduced-fat, reduced sodium cheese  cup carrot sticks  cup hummus 1 banana 1 cup 1% milk or fortified soymilk 1 cup unsweetened tea  Evening Meal 4 ounces salmon, baked  cup cooked brown rice 1 cup green beans, cooked 1 cup mixed greens salad 1 teaspoon olive oil mixed with vinegar of choice 1 whole wheat dinner roll 1 teaspoon margarine, soft, tub (for roll) 1 cup water   Evening Snack 1 cup low-fat yogurt  cup sliced peaches       HIATAL HERNIA REPAIR POST OPERATIVE INSTRUCTIONS  Thinking Clearly  The anesthesia may cause you to feel different for 1 or 2 days. Do not drive, drink alcohol, or make any big decisions  for at least 2 days.  Nutrition Take small bites, chew food well, eat slowly. Avoid carbonated beverages, avoid chewing gum, these can cause you to swallow air and cause discomfort as you may not belch as well after hiatal hernia repair. Slowly advance the consistency of your diet to solid food.   Avoid foods that  were problematic to swallow before surgery. Try to take in about 70 grams of protein per day - use protein shakes if needed to meet this goal until you're taking a more normal diet. Activity Slowly increase your activity. Be sure to get up and walk every hour or so to prevent blood clots. No heavy lifting or strenuous activity for 4 weeks following surgery to prevent hernias at your incision sites or recurrence of your hernia. It is normal to feel tired. You may need more sleep than usual.  Get your rest but make sure to get up and move around frequently to prevent blood clots and pneumonia.  Work and Return to [employer] can go back to work when you feel well enough. Discuss the timing with your surgeon. If your work requires heavy lifting or strenuous activity you need to be placed on light duty for 4 weeks following surgery. You can return to gym class, sports or other physical activities 4 weeks after surgery.  Wound Care You may experience significant bruising. Do not soak in a bathtub until cleared at your follow up appointment. You may take a shower 24 hours after surgery. If you have dermabond glue covering over the incision, allow the glue to flake off on its own. Protect the new skin, especially from the sun. The sun can burn and cause darker scarring. Your scar will heal in about 4 to 6 weeks and will become softer and continue to fade over the next year.  The cosmetic appearance of the incisions will improve over the course of the first year after surgery. Sensation around your incision will return in a few weeks or months.  Bowel Movements After intestinal surgery, you may have loose watery stools for several days. If watery diarrhea lasts longer than 3 days, contact your surgeon. Pain medication (narcotics) can cause constipation. Increase the fiber in your diet with high-fiber foods if you are constipated. You can take an over the counter stool softener like Colace to avoid  constipation.  Additional over the counter medications can also be used if Colace isn't sufficient (for example, Milk of Magnesia or Miralax).  Pain The amount of pain is different for each person. Some people need only 1 to 3 doses of pain control medication, while others need more. Take alternating doses of tylenol  and ibuprofen around the clock for the first five days following surgery.  This will provide a baseline of pain control and help with inflammation.  Take the narcotic pain medication in addition if needed for severe pain.  Contact Your Surgeon at [phone], if you have: Pain that will not go away Pain that gets worse A fever of more than 101F (38.3C) Repeated vomiting Swelling, redness, bleeding, or bad-smelling drainage from your wound site Strong abdominal pain No bowel movement or unable to pass gas for 3 days Watery diarrhea lasting longer than 3 days  Pain Control The goal of pain control is to minimize pain, keep you moving and help you heal. Your surgical team will work with you on your pain plan. Most often a combination of therapies and medications are used to control your pain. You may  also be given medication (local anesthetic) at the surgical site. This may help control your pain for several days. Extreme pain puts extra stress on your body at a time when your body needs to focus on healing. Do not wait until your pain has reached a level "10" or is unbearable before telling your doctor or nurse. It is much easier to control pain before it becomes severe. Following a laparoscopic procedure, pain is sometimes felt in the shoulder. This is due to the gas inserted into your abdomen during the procedure. Moving and walking helps to decrease the gas and the right shoulder pain.  Use the guide below for ways to manage your post-operative pain. Learn more by going to facs.org/safepaincontrol.  How Intense Is My Pain Common Therapies to Feel Better       I hardly  notice my pain, and it does not interfere with my activities.  I notice my pain and it distracts me, but I can still do activities (sitting up, walking, standing).  Non-Medication Therapies  Ice (in a bag, applied over clothing at the surgical site), elevation, rest, meditation, massage, distraction (music, TV, play) walking and mild exercise Splinting the abdomen with pillows +  Non-Opioid Medications Acetaminophen  (Tylenol ) Non-steroidal anti-inflammatory drugs (NSAIDS) Aspirin, Ibuprofen (Motrin, Advil) Naproxen (Aleve) Take these as needed, when you feel pain. Both acetaminophen  and NSAIDs help to decrease pain and swelling (inflammation).      My pain is hard to ignore and is more noticeable even when I rest.  My pain interferes with my usual activities.  Non-Medication Therapies  +  Non-Opioid medications  Take on a regular schedule (around-the-clock) instead of as needed. (For example, Tylenol  every 6 hours at 9:00 am, 3:00 pm, 9:00 pm, 3:00 am and Motrin every 6 hours at 12:00 am, 6:00 am, 12:00 pm, 6:00 pm)         I am focused on my pain, and I am not doing my daily activities.  I am groaning in pain, and I cannot sleep. I am unable to do anything.  My pain is as bad as it could be, and nothing else matters.  Non-Medication Therapies  +  Around-the-Clock Non-Opioid Medications  +  Short-acting opioids  Opioids should be used with other medications to manage severe pain. Opioids block pain and give a feeling of euphoria (feel high). Addiction, a serious side effect of opioids, is rare with short-term (a few days) use.  Examples of short-acting opioids include: Tramadol  (Ultram ), Hydrocodone (Norco, Vicodin), Hydromorphone (Dilaudid), Oxycodone  (Oxycontin )     The above directions have been adapted from the [employer] of [employer] Surgical Patient Education Program.  Please refer to the ACS website if needed:  http://chapman.info/.ashx   [facility] [address], [address], [place], [place]  [address] ?  [address] [address], [place], [place]   [address] [phone] ? [phone] ? FAX [phone] Web site: [email or web address]

## 2023-09-22 NOTE — Progress Notes (Signed)
 Progress Note     Subjective: Patient denies abdominal pain. Having minimal flatulence. Denies BM, nausea, vomiting.   ROS  All negative with the exception of above.  Objective: Vital signs in last 24 hours: Temp:  [98.3 F (36.8 C)-99 F (37.2 C)] 98.7 F (37.1 C) (09/12 0744) Pulse Rate:  [60-77] 77 (09/12 0744) Resp:  [18] 18 (09/12 0744) BP: (138-159)/(65-76) 138/76 (09/12 0744) SpO2:  [91 %-94 %] 91 % (09/12 0744) Weight:  [66.7 kg] 66.7 kg (09/12 0700) Last BM Date : 09/21/23  Intake/Output from previous day: 09/11 0701 - 09/12 0700 In: 910.9 [I.V.:880.9; NG/GT:30] Out: 700 [Emesis/NG output:700] Intake/Output this shift: Total I/O In: 0  Out: 300 [Emesis/NG output:300]  PE: Portions of physical exam performed by Dr. Dasie General: Pleasant female who is laying in bed in NAD. HEENT: Head is normocephalic, atraumatic. NGT noted at nare. Lungs: Respiratory effort nonlabored. Abd: Abdominal exam performed by Dr. Dasie. Patient denies pain during palpation. Psych: A&Ox3 with an appropriate affect.    Lab Results:  Recent Labs    09/20/23 0304 09/20/23 0935  WBC 11.5* 11.2*  HGB 11.1* 12.1  HCT 33.6* 36.9  PLT 284 254   BMET Recent Labs    09/21/23 0436 09/22/23 0348  NA 137 139  K 3.3* 3.3*  CL 105 101  CO2 18* 27  GLUCOSE 84 143*  BUN 10 5*  CREATININE 0.75 0.63  CALCIUM  7.9* 8.0*   PT/INR No results for input(s): LABPROT, INR in the last 72 hours. CMP     Component Value Date/Time   NA 139 09/22/2023 0348   K 3.3 (L) 09/22/2023 0348   CL 101 09/22/2023 0348   CO2 27 09/22/2023 0348   GLUCOSE 143 (H) 09/22/2023 0348   BUN 5 (L) 09/22/2023 0348   CREATININE 0.63 09/22/2023 0348   CREATININE 1.01 (H) 07/27/2021 0844   CREATININE 0.82 05/04/2017 0957   CALCIUM  8.0 (L) 09/22/2023 0348   PROT 6.1 (L) 09/22/2023 0348   ALBUMIN 2.6 (L) 09/22/2023 0348   AST 19 09/22/2023 0348   AST 14 (L) 07/27/2021 0844   ALT 14 09/22/2023 0348    ALT 11 07/27/2021 0844   ALKPHOS 46 09/22/2023 0348   BILITOT 0.6 09/22/2023 0348   BILITOT 0.4 07/27/2021 0844   GFRNONAA >60 09/22/2023 0348   GFRNONAA >60 07/27/2021 0844   GFRNONAA 75 05/04/2017 0957   GFRAA 87 05/04/2017 0957   Lipase     Component Value Date/Time   LIPASE 35 09/18/2023 0020       Studies/Results: US  EKG SITE RITE Result Date: 09/21/2023 If Site Rite image not attached, placement could not be confirmed due to current cardiac rhythm.  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 Radiology consulted for upper GI contrast study to evaluate severity of the obstruction. EXAM: WATER SOLUBLE UPPER GI SERIES TECHNIQUE: Single-column upper GI series was performed using water soluble contrast. This exam was performed by Kimble Clas, PA-C, and was supervised and interpreted by Dr. Jennefer. Exam limited secondary to patient's known underlying large hiatal hernia, poor po intake ability and limited mobility. CONTRAST:  Omnipaque  300 COMPARISON:  None Available. FLUOROSCOPY: Radiation Exposure Index (if provided by the fluoroscopic device): 20.60 mGy Number of Acquired Spot Images: 4 FINDINGS: Large diaphragmatic hiatal hernia with proximal stomach filling with contrast and no visualized passing of contrast past the obstruction.  NG tube visualized ending in the superior herniated part of the stomach. IMPRESSION: Limited study. Large diaphragmatic hiatal hernia. No visualized contrast passing the gastric obstruction. NG tube seen ending in the superior herniated stomach. Electronically Signed   By: Ester Sides M.D.   On: 09/21/2023 09:55    Anti-infectives: Anti-infectives (From admission, onward)    None        Assessment/Plan Hiatal Hernia with gastric outlet obstruction - CT 9/8 with large HH w/ upper 2/3 of stomach intrathoracic - S/p NGT  decompression without real improvement. Total NG output 700 reported today. - UGI 9/11 without any visualized contrast passing through obstruction, NGT seen ending in herniated stomach - patient without pain during abdominal exam - Patient will need surgical intervention for this during this admission - Planning next week for laparoscopic vs robotic repair if able - PICC ordered 9/11 - Continue TPN    FEN: NPO, NGT to LIWS, PICC and TPN VTE: LMWH ID: no current abx   - per TRH -  CAD Pre-diabetes HLD Thyroid mass Paroxysmal A. Fib w/ RVR - now rate controlled    LOS: 4 days   I reviewed hospitalist notes, last 24 h vitals and pain scores, last 48 h intake and output, last 24 h labs and trends, and last 24 h imaging results.  This care required moderate level of medical decision making.    Marjorie Carlyon Favre, [facility] 09/22/2023, 12:42 PM Please see Amion for pager number during day hours 7:00am-4:30pm

## 2023-09-23 LAB — GLUCOSE, CAPILLARY
Glucose-Capillary: 115 mg/dL — ABNORMAL HIGH (ref 70–99)
Glucose-Capillary: 121 mg/dL — ABNORMAL HIGH (ref 70–99)
Glucose-Capillary: 126 mg/dL — ABNORMAL HIGH (ref 70–99)
Glucose-Capillary: 130 mg/dL — ABNORMAL HIGH (ref 70–99)
Glucose-Capillary: 132 mg/dL — ABNORMAL HIGH (ref 70–99)
Glucose-Capillary: 142 mg/dL — ABNORMAL HIGH (ref 70–99)

## 2023-09-23 LAB — BASIC METABOLIC PANEL WITH GFR
Anion gap: 9 (ref 5–15)
BUN: 7 mg/dL — ABNORMAL LOW (ref 8–23)
CO2: 24 mmol/L (ref 22–32)
Calcium: 8.6 mg/dL — ABNORMAL LOW (ref 8.9–10.3)
Chloride: 103 mmol/L (ref 98–111)
Creatinine, Ser: 0.73 mg/dL (ref 0.44–1.00)
GFR, Estimated: >60 mL/min (ref >60)
Glucose, Bld: 129 mg/dL — ABNORMAL HIGH (ref 70–99)
Potassium: 3.8 mmol/L (ref 3.5–5.1)
Sodium: 136 mmol/L (ref 135–145)

## 2023-09-23 LAB — CBC
HCT: 37.9 % (ref 36.0–46.0)
Hemoglobin: 12.5 g/dL (ref 12.0–15.0)
MCH: 29.8 pg (ref 26.0–34.0)
MCHC: 33 g/dL (ref 30.0–36.0)
MCV: 90.2 fL (ref 80.0–100.0)
Platelets: 325 K/uL (ref 150–400)
RBC: 4.2 MIL/uL (ref 3.87–5.11)
RDW: 13.7 % (ref 11.5–15.5)
WBC: 6.9 K/uL (ref 4.0–10.5)
nRBC: 0 % (ref 0.0–0.2)

## 2023-09-23 LAB — MAGNESIUM: Magnesium: 1.9 mg/dL (ref 1.7–2.4)

## 2023-09-23 LAB — PHOSPHORUS: Phosphorus: 4.2 mg/dL (ref 2.5–4.6)

## 2023-09-23 MED ORDER — POTASSIUM CHLORIDE 10 MEQ/50ML IV SOLN
10.0000 meq | INTRAVENOUS | Status: AC
  Administered 2023-09-23 (×2): 10 meq via INTRAVENOUS
  Filled 2023-09-23 (×2): qty 50

## 2023-09-23 MED ORDER — TRAVASOL 10 % IV SOLN
INTRAVENOUS | Status: AC
  Filled 2023-09-23: qty 873.6

## 2023-09-23 NOTE — Progress Notes (Signed)
 Progress Note     Subjective: Patient denies abdominal pain. Having minimal flatulence. Denies BM, nausea, vomiting.   ROS  All negative with the exception of above.  Objective: Vital signs in last 24 hours: Temp:  [98.3 F (36.8 C)-99.4 F (37.4 C)] 98.9 F (37.2 C) (09/13 0710) Pulse Rate:  [80-89] 89 (09/13 0710) Resp:  [18] 18 (09/13 0710) BP: (126-148)/(70-88) 126/88 (09/13 0710) SpO2:  [91 %-98 %] 98 % (09/13 0710) Weight:  [66.1 kg] 66.1 kg (09/13 0709) Last BM Date : 09/23/23  Intake/Output from previous day: 09/12 0701 - 09/13 0700 In: 543.8 [I.V.:513.8; NG/GT:30] Out: 650 [Emesis/NG output:650] Intake/Output this shift: No intake/output data recorded.  PE: Portions of physical exam performed by Dr. Dasie General: Pleasant female who is laying in bed in NAD. HEENT: Head is normocephalic, atraumatic. NGT noted at nare. Lungs: Respiratory effort nonlabored. Abd: Abdominal exam performed by Dr. Dasie. Patient denies pain during palpation. Psych: A&Ox3 with an appropriate affect.    Lab Results:  Recent Labs    09/23/23 0822  WBC 6.9  HGB 12.5  HCT 37.9  PLT 325   BMET Recent Labs    09/22/23 0348 09/23/23 0259  NA 139 136  K 3.3* 3.8  CL 101 103  CO2 27 24  GLUCOSE 143* 129*  BUN 5* 7*  CREATININE 0.63 0.73  CALCIUM  8.0* 8.6*   PT/INR No results for input(s): LABPROT, INR in the last 72 hours. CMP     Component Value Date/Time   NA 136 09/23/2023 0259   K 3.8 09/23/2023 0259   CL 103 09/23/2023 0259   CO2 24 09/23/2023 0259   GLUCOSE 129 (H) 09/23/2023 0259   BUN 7 (L) 09/23/2023 0259   CREATININE 0.73 09/23/2023 0259   CREATININE 1.01 (H) 07/27/2021 0844   CREATININE 0.82 05/04/2017 0957   CALCIUM  8.6 (L) 09/23/2023 0259   PROT 6.1 (L) 09/22/2023 0348   ALBUMIN 2.6 (L) 09/22/2023 0348   AST 19 09/22/2023 0348   AST 14 (L) 07/27/2021 0844   ALT 14 09/22/2023 0348   ALT 11 07/27/2021 0844   ALKPHOS 46 09/22/2023 0348    BILITOT 0.6 09/22/2023 0348   BILITOT 0.4 07/27/2021 0844   GFRNONAA >60 09/23/2023 0259   GFRNONAA >60 07/27/2021 0844   GFRNONAA 75 05/04/2017 0957   GFRAA 87 05/04/2017 0957   Lipase     Component Value Date/Time   LIPASE 35 09/18/2023 0020       Studies/Results: No results found.   Anti-infectives: Anti-infectives (From admission, onward)    None        Assessment/Plan Hiatal Hernia with gastric outlet obstruction - CT 9/8 with large HH w/ upper 2/3 of stomach intrathoracic - S/p NGT decompression without real improvement. Total NG output 650 yesterday. - UGI 9/11 without any visualized contrast passing through obstruction, NGT seen ending in herniated stomach - patient without pain during abdominal exam - Patient will need surgical intervention for this during this admission - Planning next week for laparoscopic vs robotic repair if able - PICC ordered 9/11 - Continue TPN    FEN: NPO, NGT to LIWS, PICC and TPN VTE: LMWH ID: no current abx   - per TRH -  CAD Pre-diabetes HLD Thyroid mass Paroxysmal A. Fib w/ RVR - now rate controlled    LOS: 5 days   I reviewed hospitalist notes, last 24 h vitals and pain scores, last 48 h intake and output, last 24 h labs  and trends, and last 24 h imaging results.  This care required low level of medical decision making.    Deward JINNY Foy, MD  [facility] 09/23/2023, 3:21 PM Please see Amion for pager number during day hours 7:00am-4:30pm

## 2023-09-23 NOTE — Plan of Care (Signed)

## 2023-09-24 LAB — BASIC METABOLIC PANEL WITH GFR
Anion gap: 14 (ref 5–15)
BUN: 15 mg/dL (ref 8–23)
CO2: 22 mmol/L (ref 22–32)
Calcium: 8.9 mg/dL (ref 8.9–10.3)
Chloride: 102 mmol/L (ref 98–111)
Creatinine, Ser: 0.65 mg/dL (ref 0.44–1.00)
GFR, Estimated: >60 mL/min (ref >60)
Glucose, Bld: 129 mg/dL — ABNORMAL HIGH (ref 70–99)
Potassium: 4.3 mmol/L (ref 3.5–5.1)
Sodium: 138 mmol/L (ref 135–145)

## 2023-09-24 LAB — MAGNESIUM: Magnesium: 2 mg/dL (ref 1.7–2.4)

## 2023-09-24 LAB — MRSA NEXT GEN BY PCR, NASAL: MRSA by PCR Next Gen: NOT DETECTED

## 2023-09-24 LAB — GLUCOSE, CAPILLARY
Glucose-Capillary: 125 mg/dL — ABNORMAL HIGH (ref 70–99)
Glucose-Capillary: 136 mg/dL — ABNORMAL HIGH (ref 70–99)
Glucose-Capillary: 142 mg/dL — ABNORMAL HIGH (ref 70–99)

## 2023-09-24 LAB — PHOSPHORUS: Phosphorus: 4.1 mg/dL (ref 2.5–4.6)

## 2023-09-24 MED ORDER — CEFAZOLIN SODIUM-DEXTROSE 2-4 GM/100ML-% IV SOLN
2.0000 g | INTRAVENOUS | Status: DC
Start: 2023-09-24 — End: 2023-09-25
  Filled 2023-09-24: qty 100

## 2023-09-24 MED ORDER — TRAVASOL 10 % IV SOLN
INTRAVENOUS | Status: AC
  Filled 2023-09-24: qty 873.6

## 2023-09-24 NOTE — Plan of Care (Signed)

## 2023-09-24 NOTE — Progress Notes (Signed)
      Chief Complaint/Subjective: No new issues, sore throat  Objective: Vital signs in last 24 hours: Temp:  [97 F (36.1 C)-99.3 F (37.4 C)] 97 F (36.1 C) (09/14 0722) Pulse Rate:  [82-95] 95 (09/14 0722) Resp:  [18] 18 (09/14 0722) BP: (115-131)/(63-89) 115/70 (09/14 0722) SpO2:  [92 %-100 %] 100 % (09/14 0722) Weight:  [67.1 kg] 67.1 kg (09/14 0705) Last BM Date : 09/23/23 Intake/Output from previous day: 09/13 0701 - 09/14 0700 In: 921 [I.V.:921] Out: 400 [Emesis/NG output:400]  PE: Gen: NAD Resp: nonlabored Card: RRR Abd: soft, NG tube in position with yellow liquid output  Lab Results:  Recent Labs    09/23/23 0822  WBC 6.9  HGB 12.5  HCT 37.9  PLT 325   Recent Labs    09/23/23 0259 09/24/23 0422  NA 136 138  K 3.8 4.3  CL 103 102  CO2 24 22  GLUCOSE 129* 129*  BUN 7* 15  CREATININE 0.73 0.65  CALCIUM  8.6* 8.9   No results for input(s): LABPROT, INR in the last 72 hours.    Component Value Date/Time   NA 138 09/24/2023 0422   K 4.3 09/24/2023 0422   CL 102 09/24/2023 0422   CO2 22 09/24/2023 0422   GLUCOSE 129 (H) 09/24/2023 0422   BUN 15 09/24/2023 0422   CREATININE 0.65 09/24/2023 0422   CREATININE 1.01 (H) 07/27/2021 0844   CREATININE 0.82 05/04/2017 0957   CALCIUM  8.9 09/24/2023 0422   PROT 6.1 (L) 09/22/2023 0348   ALBUMIN 2.6 (L) 09/22/2023 0348   AST 19 09/22/2023 0348   AST 14 (L) 07/27/2021 0844   ALT 14 09/22/2023 0348   ALT 11 07/27/2021 0844   ALKPHOS 46 09/22/2023 0348   BILITOT 0.6 09/22/2023 0348   BILITOT 0.4 07/27/2021 0844   GFRNONAA >60 09/24/2023 0422   GFRNONAA >60 07/27/2021 0844   GFRNONAA 75 05/04/2017 0957   GFRAA 87 05/04/2017 0957    Assessment/Plan Obstructing HH, plan for OR tomorrow  FEN - NPO, NG tube, TPN VTE - lovenox  40 mg daily ID - periop abx Disposition - inpatient, surgery tomorrow   LOS: 6 days   I reviewed last 24 h vitals and pain scores, last 48 h intake and output, last 24 h  labs and trends, and last 24 h imaging results.  This care required moderate level of medical decision making.   Herlene Righter [facility] Surgery at [facility] 09/24/2023, 10:17 AM Please see Amion for pager number during day hours 7:00am-4:30pm or 7:00am -11:30am on weekends

## 2023-09-24 NOTE — Progress Notes (Signed)
 PHARMACY - TOTAL PARENTERAL NUTRITION CONSULT NOTE  Indication:  GOO with hiatal hernia   Patient Measurements: Height: 5' 3 (160 cm) Weight: 67.1 kg (147 lb 14.9 oz) IBW/kg (Calculated) : 52.4 TPN AdjBW (KG): 56.5 Body mass index is 26.2 kg/m. Usual Weight: 68 kg. Patient states she has not lost any weight in the most recent 3 months time frame.  Assessment:  51 YOF admitted with a PMH significant for hiatal hernia with GOO requiring surgical intervention. She has had N/V since Saturday. The vomiting was accompanied by upper abdominal pain. Emesis was brown in color with no evidence of blood. She has began having nausea since early 2024 when eating which prompted a visit to PCP resulting in a referral to GI who discovered a twisted colon identified on prior colonoscopy. Most recent food intake was noted as rice pilaf with a spice packet. Consult for TPN has been received.  Noted shellfish allergy.  Patient has tolerated SMOFlipids.  Glucose / Insulin : hx preDM - CBGs well controlled Used 0 unit SSI in the past 24 hrs Electrolytes: all WNL Renal: SCr < 1, BUN WNL Hepatic: LFTs / tbili WNL, albumin 2.6 Intake / Output; MIVF: UOP not charted, NG , LBM 9/11 GI Imaging: 9/8 CT: large hiatal hernia upper 2/3 stomach intrathoracic, appears to be impaired gastric emptying GI Surgeries / Procedures:  none  Central access: PICC placed 09/21/23 TPN start date: 09/21/23  Nutritional Goals: Goal TPN rate is 70 mL/hr (= 87g AA and 1716 kcals per day)  RD Estimated Needs Total Energy Estimated Needs: 1700-1900 kcals Total Protein Estimated Needs: 80-95g Total Fluid Estimated Needs: 1.7-1.9L/day  Current Nutrition:  TPN  Plan:  Continue TPN at goal rate of 45mL/hr to provide 100% of needs Electrolytes in TPN: Na 80 mEq/L, K 60 mEq/L, Ca 5 mEq/L, Mg 8 mEq/L, reduce Phos slightly to 9 mmol/L, change Cl:Ac to 1:1  Add standard MVI and trace elements to TPN D/C SSI/CBG checks Monitor  TPN labs on Mon/Thurs  F/u Surgery plans:  possible laparoscopic vs robotic repair of hernia  Zeyna Mkrtchyan D. Lendell, PharmD, BCPS, BCCCP 09/24/2023, 7:33 AM

## 2023-09-24 NOTE — Anesthesia Preprocedure Evaluation (Addendum)
 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 LV EF 60 to 65%. The  left ventricle has normal function. The left ventricle has no regional  wall motion abnormalities. Left ventricular diastolic parameters were  normal.   2. Right ventricular systolic function is normal. The right ventricular  size is normal.   3. Mobile calcified chords . The mitral valve is abnormal. Trivial mitral  valve regurgitation. No evidence of mitral stenosis.   4. The aortic valve is tricuspid. Aortic valve regurgitation is not  visualized.     Neuro/Psych    GI/Hepatic Neg liver ROS, hiatal hernia,,,Gastric Obstruction S/p NG Tube   Endo/Other  neg diabetes  Thyroid Nodule  Renal/GU Renal disease     Musculoskeletal   Abdominal   Peds  Hematology Hgb 12.5 (9/13)   Anesthesia Other Findings   Reproductive/Obstetrics                              Anesthesia Physical Anesthesia Plan  ASA: 3  Anesthesia Plan: General   Post-op Pain Management:    Induction: Intravenous, Rapid sequence and Cricoid pressure planned  PONV Risk Score and Plan: 3 and Treatment may vary due to age or medical condition  Airway Management Planned: Oral ETT  Additional Equipment:   Intra-op  Plan:   Post-operative Plan: Extubation in OR  Informed Consent:      Dental advisory given  Plan Discussed with:   Anesthesia Plan Comments:          Anesthesia Quick Evaluation

## 2023-09-25 ENCOUNTER — Inpatient Hospital Stay (HOSPITAL_COMMUNITY): Payer: Self-pay

## 2023-09-25 ENCOUNTER — Encounter (HOSPITAL_COMMUNITY): Admission: EM | Disposition: A | Discharge status: Home / Self Care | Payer: Self-pay | Source: Home / Self Care

## 2023-09-25 ENCOUNTER — Other Ambulatory Visit: Payer: Self-pay

## 2023-09-25 ENCOUNTER — Encounter (HOSPITAL_COMMUNITY): Payer: Self-pay | Admitting: Internal Medicine

## 2023-09-25 DIAGNOSIS — I2510 Atherosclerotic heart disease of native coronary artery without angina pectoris: Secondary | ICD-10-CM | POA: Diagnosis not present

## 2023-09-25 DIAGNOSIS — I739 Peripheral vascular disease, unspecified: Secondary | ICD-10-CM

## 2023-09-25 DIAGNOSIS — I4891 Unspecified atrial fibrillation: Secondary | ICD-10-CM | POA: Diagnosis not present

## 2023-09-25 DIAGNOSIS — K449 Diaphragmatic hernia without obstruction or gangrene: Secondary | ICD-10-CM

## 2023-09-25 HISTORY — PX: ESOPHAGOGASTRODUODENOSCOPY: SHX5428

## 2023-09-25 HISTORY — PX: INSERTION OF MESH: SHX5868

## 2023-09-25 HISTORY — PX: HIATAL HERNIA REPAIR: SHX195

## 2023-09-25 LAB — GLUCOSE, CAPILLARY: Glucose-Capillary: 151 mg/dL — ABNORMAL HIGH (ref 70–99)

## 2023-09-25 LAB — COMPREHENSIVE METABOLIC PANEL WITH GFR
ALT: 25 U/L (ref 0–44)
AST: 27 U/L (ref 15–41)
Albumin: 2.9 g/dL — ABNORMAL LOW (ref 3.5–5.0)
Alkaline Phosphatase: 51 U/L (ref 38–126)
Anion gap: 9 (ref 5–15)
BUN: 19 mg/dL (ref 8–23)
CO2: 25 mmol/L (ref 22–32)
Calcium: 8.7 mg/dL — ABNORMAL LOW (ref 8.9–10.3)
Chloride: 101 mmol/L (ref 98–111)
Creatinine, Ser: 0.72 mg/dL (ref 0.44–1.00)
GFR, Estimated: >60 mL/min
Glucose, Bld: 122 mg/dL — ABNORMAL HIGH (ref 70–99)
Potassium: 4.3 mmol/L (ref 3.5–5.1)
Sodium: 135 mmol/L (ref 135–145)
Total Bilirubin: 0.5 mg/dL (ref 0.0–1.2)
Total Protein: 7.3 g/dL (ref 6.5–8.1)

## 2023-09-25 LAB — MAGNESIUM: Magnesium: 2.1 mg/dL (ref 1.7–2.4)

## 2023-09-25 LAB — TRIGLYCERIDES: Triglycerides: 90 mg/dL

## 2023-09-25 LAB — PHOSPHORUS: Phosphorus: 4 mg/dL (ref 2.5–4.6)

## 2023-09-25 SURGERY — REPAIR, HERNIA, HIATAL, LAPAROSCOPIC
Anesthesia: General | Site: Abdomen

## 2023-09-25 MED ORDER — PROPOFOL 10 MG/ML IV BOLUS
INTRAVENOUS | Status: AC
  Filled 2023-09-25: qty 20

## 2023-09-25 MED ORDER — CHLORHEXIDINE GLUCONATE 0.12 % MT SOLN
15.0000 mL | Freq: Once | OROMUCOSAL | Status: AC
  Filled 2023-09-25: qty 15

## 2023-09-25 MED ORDER — LACTATED RINGERS IV SOLN: INTRAVENOUS | Status: DC

## 2023-09-25 MED ORDER — BUPIVACAINE-EPINEPHRINE 0.25% -1:200000 IJ SOLN
INTRAMUSCULAR | Status: DC | PRN
  Administered 2023-09-25: 30 mL

## 2023-09-25 MED ORDER — OXYCODONE HCL 5 MG/5ML PO SOLN: 5.0000 mg | Freq: Once | ORAL | Status: DC | PRN

## 2023-09-25 MED ORDER — FENTANYL CITRATE (PF) 100 MCG/2ML IJ SOLN
INTRAMUSCULAR | Status: AC
  Filled 2023-09-25: qty 2

## 2023-09-25 MED ORDER — ROCURONIUM BROMIDE 10 MG/ML (PF) SYRINGE
PREFILLED_SYRINGE | INTRAVENOUS | Status: AC
  Filled 2023-09-25: qty 10

## 2023-09-25 MED ORDER — ONDANSETRON HCL 4 MG/2ML IJ SOLN
INTRAMUSCULAR | Status: DC | PRN
  Administered 2023-09-25: 4 mg via INTRAVENOUS

## 2023-09-25 MED ORDER — FENTANYL CITRATE (PF) 250 MCG/5ML IJ SOLN
INTRAMUSCULAR | Status: DC | PRN
  Administered 2023-09-25 (×4): 50 ug via INTRAVENOUS
  Administered 2023-09-25: 100 ug via INTRAVENOUS
  Administered 2023-09-25 (×3): 50 ug via INTRAVENOUS

## 2023-09-25 MED ORDER — FENTANYL CITRATE (PF) 250 MCG/5ML IJ SOLN
INTRAMUSCULAR | Status: AC
  Filled 2023-09-25: qty 5

## 2023-09-25 MED ORDER — ALBUMIN HUMAN 5 % IV SOLN: INTRAVENOUS | Status: DC | PRN

## 2023-09-25 MED ORDER — DROPERIDOL 2.5 MG/ML IJ SOLN: 0.6250 mg | Freq: Once | INTRAMUSCULAR | Status: DC | PRN

## 2023-09-25 MED ORDER — ONDANSETRON HCL 4 MG/2ML IJ SOLN
INTRAMUSCULAR | Status: AC
  Filled 2023-09-25: qty 2

## 2023-09-25 MED ORDER — FENTANYL CITRATE (PF) 100 MCG/2ML IJ SOLN: 25.0000 ug | INTRAMUSCULAR | Status: DC | PRN

## 2023-09-25 MED ORDER — ONDANSETRON HCL 4 MG/2ML IJ SOLN: 4.0000 mg | Freq: Four times a day (QID) | INTRAMUSCULAR | Status: DC | PRN

## 2023-09-25 MED ORDER — DEXAMETHASONE SODIUM PHOSPHATE 10 MG/ML IJ SOLN
INTRAMUSCULAR | Status: AC
  Filled 2023-09-25: qty 1

## 2023-09-25 MED ORDER — ONDANSETRON HCL 4 MG/2ML IJ SOLN: 4.0000 mg | Freq: Once | INTRAMUSCULAR | Status: DC | PRN

## 2023-09-25 MED ORDER — DEXAMETHASONE SODIUM PHOSPHATE 10 MG/ML IJ SOLN
INTRAMUSCULAR | Status: DC | PRN
  Administered 2023-09-25: 5 mg via INTRAVENOUS

## 2023-09-25 MED ORDER — TRAMADOL HCL 50 MG PO TABS: 50.0000 mg | ORAL_TABLET | Freq: Four times a day (QID) | ORAL | Status: DC | PRN

## 2023-09-25 MED ORDER — OXYCODONE HCL 5 MG PO TABS: 5.0000 mg | ORAL_TABLET | Freq: Once | ORAL | Status: DC | PRN

## 2023-09-25 MED ORDER — TRAVASOL 10 % IV SOLN
INTRAVENOUS | Status: AC
  Filled 2023-09-25: qty 873.6

## 2023-09-25 MED ORDER — ROCURONIUM BROMIDE 10 MG/ML (PF) SYRINGE
PREFILLED_SYRINGE | INTRAVENOUS | Status: DC | PRN
  Administered 2023-09-25: 10 mg via INTRAVENOUS
  Administered 2023-09-25: 70 mg via INTRAVENOUS
  Administered 2023-09-25: 5 mg via INTRAVENOUS
  Administered 2023-09-25: 20 mg via INTRAVENOUS

## 2023-09-25 MED ORDER — CHLORHEXIDINE GLUCONATE 0.12 % MT SOLN
OROMUCOSAL | Status: AC
  Administered 2023-09-25: 15 mL via OROMUCOSAL
  Filled 2023-09-25: qty 15

## 2023-09-25 MED ORDER — SUGAMMADEX SODIUM 200 MG/2ML IV SOLN
INTRAVENOUS | Status: DC | PRN
  Administered 2023-09-25: 50 mg via INTRAVENOUS
  Administered 2023-09-25: 100 mg via INTRAVENOUS
  Administered 2023-09-25: 50 mg via INTRAVENOUS

## 2023-09-25 MED ORDER — FENTANYL CITRATE (PF) 100 MCG/2ML IJ SOLN
25.0000 ug | INTRAMUSCULAR | Status: DC | PRN
  Administered 2023-09-25: 25 ug via INTRAVENOUS

## 2023-09-25 MED ORDER — SODIUM CHLORIDE (PF) 0.9 % IJ SOLN
INTRAMUSCULAR | Status: AC
  Administered 2023-09-25: 10 mL
  Filled 2023-09-25: qty 10

## 2023-09-25 MED ORDER — LIDOCAINE 2% (20 MG/ML) 5 ML SYRINGE
INTRAMUSCULAR | Status: DC | PRN
  Administered 2023-09-25: 80 mg via INTRAVENOUS

## 2023-09-25 MED ORDER — ACETAMINOPHEN 650 MG RE SUPP: 650.0000 mg | Freq: Four times a day (QID) | RECTAL | Status: DC | PRN

## 2023-09-25 MED ORDER — ORAL CARE MOUTH RINSE: 15.0000 mL | Freq: Once | OROMUCOSAL | Status: AC

## 2023-09-25 MED ORDER — ACETAMINOPHEN 325 MG PO TABS
650.0000 mg | ORAL_TABLET | Freq: Four times a day (QID) | ORAL | Status: DC | PRN
  Administered 2023-09-25 – 2023-09-28 (×5): 650 mg via ORAL
  Filled 2023-09-25 (×6): qty 2

## 2023-09-25 MED ORDER — CEFAZOLIN SODIUM-DEXTROSE 2-3 GM-%(50ML) IV SOLR
INTRAVENOUS | Status: DC | PRN
  Administered 2023-09-25: 2 g via INTRAVENOUS

## 2023-09-25 MED ORDER — 0.9 % SODIUM CHLORIDE (POUR BTL) OPTIME
TOPICAL | Status: DC | PRN
  Administered 2023-09-25: 1000 mL

## 2023-09-25 MED ORDER — STERILE WATER FOR IRRIGATION IR SOLN
Status: DC | PRN
  Administered 2023-09-25: 1000 mL

## 2023-09-25 MED ORDER — ONDANSETRON HCL 4 MG PO TABS: 4.0000 mg | ORAL_TABLET | Freq: Four times a day (QID) | ORAL | Status: DC | PRN

## 2023-09-25 MED ORDER — PHENYLEPHRINE 80 MCG/ML (10ML) SYRINGE FOR IV PUSH (FOR BLOOD PRESSURE SUPPORT)
PREFILLED_SYRINGE | INTRAVENOUS | Status: DC | PRN
  Administered 2023-09-25 (×3): 80 ug via INTRAVENOUS
  Administered 2023-09-25: 160 ug via INTRAVENOUS
  Administered 2023-09-25 (×2): 80 ug via INTRAVENOUS

## 2023-09-25 MED ORDER — PROPOFOL 10 MG/ML IV BOLUS
INTRAVENOUS | Status: DC | PRN
  Administered 2023-09-25: 100 mg via INTRAVENOUS
  Administered 2023-09-25: 20 mg via INTRAVENOUS

## 2023-09-25 MED ORDER — LABETALOL HCL 5 MG/ML IV SOLN
10.0000 mg | INTRAVENOUS | Status: DC | PRN
Start: 2023-09-25 — End: 2023-09-25

## 2023-09-25 MED ORDER — LIDOCAINE 2% (20 MG/ML) 5 ML SYRINGE
INTRAMUSCULAR | Status: AC
  Filled 2023-09-25: qty 5

## 2023-09-25 SURGICAL SUPPLY — 50 items
BAG COUNTER SPONGE SURGICOUNT (BAG) ×1 IMPLANT
BUTTON OLYMPUS DEFENDO 5 PIECE (MISCELLANEOUS) IMPLANT
CANISTER SUCTION 3000ML PPV (SUCTIONS) ×1 IMPLANT
CLAMP ENDO BABCK 10MM (STAPLE) ×1 IMPLANT
CLIP APPLIE ROT 10 11.4 M/L (STAPLE) IMPLANT
COVER SURGICAL LIGHT HANDLE (MISCELLANEOUS) ×1 IMPLANT
DERMABOND ADVANCED .7 DNX12 (GAUZE/BANDAGES/DRESSINGS) IMPLANT
DEVICE SUTURE ENDOST 10MM (ENDOMECHANICALS) ×1 IMPLANT
DISSECTOR BLUNT TIP ENDO 5MM (MISCELLANEOUS) IMPLANT
DRAIN PENROSE 0.5X18 (DRAIN) ×1 IMPLANT
DRAIN PENROSE 0.75X12 (DRAIN) IMPLANT
DRAPE LAPAROSCOPIC ABDOMINAL (DRAPES) ×2 IMPLANT
ELECTRODE REM PT RTRN 9FT ADLT (ELECTROSURGICAL) ×1 IMPLANT
GLOVE BIOGEL M 8.0 STRL (GLOVE) ×1 IMPLANT
GLOVE BIOGEL PI IND STRL 8 (GLOVE) ×1 IMPLANT
GOWN STRL REUS W/ TWL LRG LVL3 (GOWN DISPOSABLE) ×1 IMPLANT
GOWN STRL REUS W/ TWL XL LVL3 (GOWN DISPOSABLE) ×1 IMPLANT
IRRIGATION SUCT STRKRFLW 2 WTP (MISCELLANEOUS) ×1 IMPLANT
KIT BASIN OR (CUSTOM PROCEDURE TRAY) ×1 IMPLANT
LIGASURE LAP MARYLAND 5MM 37CM (ELECTROSURGICAL) IMPLANT
MESH BIO-A 7X10 SYN MAT (Mesh General) IMPLANT
NDL INSUFFLATION 14GA 120MM (NEEDLE) IMPLANT
NEEDLE INSUFFLATION 14GA 120MM (NEEDLE) ×1 IMPLANT
NS IRRIG 1000ML POUR BTL (IV SOLUTION) ×1 IMPLANT
PENCIL BUTTON HOLSTER BLD 10FT (ELECTRODE) IMPLANT
POUCH LAPAROSCOPIC INSTRUMENT (MISCELLANEOUS) ×1 IMPLANT
POUCH RETRIEVAL ECOSAC 10 (ENDOMECHANICALS) IMPLANT
SCISSORS LAP 5X35 DISP (ENDOMECHANICALS) ×1 IMPLANT
SHEARS HARMONIC 36 ACE (MISCELLANEOUS) IMPLANT
SHEARS HARMONIC ACE PLUS 45CM (MISCELLANEOUS) IMPLANT
SLEEVE ADV FIXATION 5X100MM (TROCAR) IMPLANT
SPIKE FLUID TRANSFER (MISCELLANEOUS) ×1 IMPLANT
STAPLER SKIN PROX 35W (STAPLE) ×1 IMPLANT
SUT ETHIBOND 0 (SUTURE) IMPLANT
SUT ETHIBOND 0 MO6 C/R (SUTURE) IMPLANT
SUT SILK 0 SH 30 (SUTURE) IMPLANT
SUT SURGIDAC NAB ES-9 0 48 120 (SUTURE) IMPLANT
TIP INNERVISION DETACH 40FR (MISCELLANEOUS) IMPLANT
TIP INNERVISION DETACH 50FR (MISCELLANEOUS) IMPLANT
TIP INNERVISION DETACH 56FR (MISCELLANEOUS) IMPLANT
TRAY FOLEY MTR SLVR 14FR STAT (SET/KITS/TRAYS/PACK) ×1 IMPLANT
TRAY LAPAROSCOPIC MC (CUSTOM PROCEDURE TRAY) ×1 IMPLANT
TROCAR 11X100 Z THREAD (TROCAR) IMPLANT
TROCAR ADV FIXATION 5X100MM (TROCAR) IMPLANT
TROCAR BALLN 12MMX100 BLUNT (TROCAR) IMPLANT
TROCAR Z-THREAD OPTICAL 5X100M (TROCAR) IMPLANT
TUBING ENDO SMARTCAP (MISCELLANEOUS) IMPLANT
TUBING EVAC SMOKE HEATED PNEUM (TUBING) ×1 IMPLANT
WARMER LAPAROSCOPE (MISCELLANEOUS) ×1 IMPLANT
WATER STERILE IRR 1000ML POUR (IV SOLUTION) ×1 IMPLANT

## 2023-09-25 NOTE — Progress Notes (Signed)
*   Day of Surgery *   Chief Complaint/Subjective: Seen in preop  Objective: Vital signs in last 24 hours: Temp:  [98 F (36.7 C)-99.1 F (37.3 C)] 99.1 F (37.3 C) (09/15 0803) Pulse Rate:  [91-97] 91 (09/15 0803) Resp:  [16-18] 18 (09/15 0803) BP: (109-138)/(77-85) 109/78 (09/15 0803) SpO2:  [93 %-95 %] 94 % (09/15 0803) Last BM Date : 09/23/23 Intake/Output from previous day: 09/14 0701 - 09/15 0700 In: 806 [I.V.:806] Out: 350 [Emesis/NG output:350]  PE: Gen: NAD Resp: nonlabored Card: RRR Abd: soft, NG tube in position with yellow liquid output  Lab Results:  Recent Labs    09/23/23 0822  WBC 6.9  HGB 12.5  HCT 37.9  PLT 325   Recent Labs    09/24/23 0422 09/25/23 0243  NA 138 135  K 4.3 4.3  CL 102 101  CO2 22 25  GLUCOSE 129* 122*  BUN 15 19  CREATININE 0.65 0.72  CALCIUM  8.9 8.7*   No results for input(s): LABPROT, INR in the last 72 hours.    Component Value Date/Time   NA 135 09/25/2023 0243   K 4.3 09/25/2023 0243   CL 101 09/25/2023 0243   CO2 25 09/25/2023 0243   GLUCOSE 122 (H) 09/25/2023 0243   BUN 19 09/25/2023 0243   CREATININE 0.72 09/25/2023 0243   CREATININE 1.01 (H) 07/27/2021 0844   CREATININE 0.82 05/04/2017 0957   CALCIUM  8.7 (L) 09/25/2023 0243   PROT 7.3 09/25/2023 0243   ALBUMIN  2.9 (L) 09/25/2023 0243   AST 27 09/25/2023 0243   AST 14 (L) 07/27/2021 0844   ALT 25 09/25/2023 0243   ALT 11 07/27/2021 0844   ALKPHOS 51 09/25/2023 0243   BILITOT 0.5 09/25/2023 0243   BILITOT 0.4 07/27/2021 0844   GFRNONAA >60 09/25/2023 0243   GFRNONAA >60 07/27/2021 0844   GFRNONAA 75 05/04/2017 0957   GFRAA 87 05/04/2017 0957    Assessment/Plan Hiatal Hernia with gastric outlet obstruction - CT 9/8 with large HH w/ upper 2/3 of stomach intrathoracic - S/p NGT decompression. Total NG output 650 yesterday. - UGI 9/11 without any visualized contrast passing through obstruction, NGT seen ending in herniated stomach  -  Recommend proceeding with laparoscopic hiatal hernia repair, possible gastrostomy tube.  We discussed the procedure, its risks, benefits and alterantives.  After a full discussion and all questions answered, the patient granted consent to proceed.   FEN - NPO, NG tube, TPN VTE - lovenox  40 mg daily ID - periop abx Disposition - inpatient, surgery today   LOS: 7 days   I reviewed last 24 h vitals and pain scores, last 48 h intake and output, last 24 h labs and trends, and last 24 h imaging results.  This care required moderate level of medical decision making.   Deward PARAS [facility] Surgery at [facility] 09/25/2023, 8:54 AM Please see Amion for pager number during day hours 7:00am-4:30pm or 7:00am -11:30am on weekends

## 2023-09-25 NOTE — Progress Notes (Signed)
 Pt belonging (Silver necklace) returned to patient after hernia repair by primary nurse Chaney. This RN witnessed.

## 2023-09-25 NOTE — Anesthesia Procedure Notes (Addendum)
 Procedure Name: Intubation Date/Time: 09/25/2023 9:18 AM  Performed by: Atanacio Arland HERO, CRNAPre-anesthesia Checklist: Patient identified, Emergency Drugs available, Suction available and Patient being monitored Patient Re-evaluated:Patient Re-evaluated prior to induction Oxygen Delivery Method: Circle System Utilized Preoxygenation: Pre-oxygenation with 100% oxygen Induction Type: IV induction and Rapid sequence Laryngoscope Size: Mac and 4 Grade View: Grade I Tube type: Oral Tube size: 6.5 mm Number of attempts: 1 Airway Equipment and Method: Stylet Placement Confirmation: ETT inserted through vocal cords under direct vision, positive ETCO2 and breath sounds checked- equal and bilateral Secured at: 21 cm Tube secured with: Tape Dental Injury: Teeth and Oropharynx as per pre-operative assessment

## 2023-09-25 NOTE — Plan of Care (Signed)
  Problem: Health Behavior/Discharge Planning: Goal: Ability to manage health-related needs will improve Outcome: Progressing   Problem: Clinical Measurements: Goal: Cardiovascular complication will be avoided Outcome: Progressing   Problem: Nutrition: Goal: Adequate nutrition will be maintained Outcome: Progressing   

## 2023-09-25 NOTE — Anesthesia Postprocedure Evaluation (Signed)
 Anesthesia Post Note  Patient: Shelly Hammond  Procedure(s) Performed: REPAIR, HERNIA, HIATAL, LAPAROSCOPIC WITH MESH (Abdomen) EGD (ESOPHAGOGASTRODUODENOSCOPY) (Abdomen) INSERTION OF MESH (Abdomen)     Patient location during evaluation: PACU Anesthesia Type: General Level of consciousness: awake and alert Pain management: pain level controlled Vital Signs Assessment: post-procedure vital signs reviewed and stable Respiratory status: spontaneous breathing, nonlabored ventilation and respiratory function stable Cardiovascular status: blood pressure returned to baseline and stable Postop Assessment: no apparent nausea or vomiting Anesthetic complications: no   No notable events documented.  Last Vitals:  Vitals:   09/25/23 1330 09/25/23 1350  BP: (!) 162/79 (!) 166/75  Pulse: 83 80  Resp: 19 18  Temp: 36.7 C (!) 36.3 C  SpO2: 93% 94%    Last Pain:  Vitals:   09/25/23 1350  TempSrc: Oral  PainSc:                  Lauraine KATHEE [place]

## 2023-09-25 NOTE — Op Note (Signed)
 Patient: Shelly Hammond (March 19, 1950, [id number])  Date of Surgery: 09/25/2023  Preoperative Diagnosis: Hiatal hernia   Postoperative Diagnosis: Hiatal hernia   Surgical Procedure: Laparoscopic hiatal hernia repair with mesh Reduction of gastric volvulus Upper endoscopy  Operative Team Members:  Surgeons and Role:    * Abree Romick, Deward PARAS, MD - Primary   Anesthesiologist: Waddell Lauraine NOVAK, MD CRNA: Atanacio Arland HERO, CRNA; Claudene Florina Boga, CRNA   Anesthesia: General   Fluids:  Total I/O In: 1350 [I.V.:1000; IV Piggyback:350] Out: 140 [Urine:115; Blood:25]  Complications: * No complications entered in OR log *  Drains:  none   Specimen: * No specimens in log *   Disposition:  PACU - hemodynamically stable.  Plan of Care: Continue inpatient care  Indications for Procedure: Shelly Hammond is a 73 y.o. female who presented with abdominal pain nausea and vomiting, found to have a obstructing hiatal hernia.  A nasogastric tube was placed and the stomach was able to be decompressed.  She was placed on TPN in preparation for surgery.  I recommended laparoscopic hiatal hernia repair possibly with mesh, possibly with a gastrostomy tube.  We discussed the procedure itself as well as its risk, benefits, and alternatives.  After full discussion all questions answered the patient granted consent to proceed.   Findings: Massive hiatal hernia with a gastric volvulus  Description of Procedure:   The patient was positioned supine, padded and secured to the bed.  The abdomen was widely prepped and draped.  A time out procedure was performed.   The abdomen was inflated at Palmer's point using a Veress needle to 15 mmHg.  A 5 mm trocar was placed in the left lateral subcostal position.  The abdomen was inspected and dense adhesions were noted within the abdomen across the mid abdomen.  A second 5 mm trocar was placed in the left periumbilical region and these adhesions were lysed sharply using  scissors.  I was unable to place 2 additional trocars in the right periumbilical region in the right subcostal region.  A Nathanson liver retractor was then placed to retract the left lobe of the liver.  The patient was placed in reverse Trendelenburg position.  This provided adequate exposure of the hiatus so I could begin the surgery.  Beginning at the twelve o'clock position on the crus, the hernia sac was grasped and reduced form the mediastinum.  The hernia sac was divided to enter the plane between the sac and the mediastinum.  The hernia sac was divided towards the left and right with continued traction on the hernia sac to reduce it.  A mediastinal dissection was performed to further reduce the hernia sac which facilitated reduction of the stomach as well.  The hernia sac was disconnected from the right and left crura and subsequently, the hernia sac was dissected from the stomach and esophagus, and excised.  Once the sac was reduced, and the suction of the chest was overcome, I was able to reduce the stomach and detorsed the volvulus.  The volvulus had dense adhesions between the stomach and the left crus which took time and care to divide.  These were divided using the [place]  ligasure.  The short gastric vessels were divided from the inferior pole of the spleen, superiorly, to completely disconnect the stomach from the spleen and the left hemidiaphragm.  All posterior gastric attachments to the lesser sac and retroperitoneum were divided.  The left crus was further delineated.  A retroesophageal window was created and a  3/4" Penrose drain was used to encircle the esophagus for retraction.    A high, circumferential mediastinal dissection was performed in an effort to mobilize the esophagus and provide for adequate intraabdominal esophageal length.  The mediastinal dissection was performed bluntly, with little to no thermal energy.  The anterior and posterior vagus nerves were both identified and  preserved.  The crural defect was reapproximated with multiple, interrupted, 0 Ethibond sutures.  The crural pillars came together well without tearing of the adjacent diaphragmatic tissue.    I did not feel a fundoplication was necessary as the stomach had reduced nicely into the abdomen and the chief complaint was dysphagia and gastric volvulus, not acid reflux..  A piece of Bio-A absorbable mesh was placed behind the stomach and esophagus to reinforce the crural closure.  This was fixed in place in 2 positions to the right and left of the esophagus.  One 0 Ethibond suture was placed from the 12 o'clock position on the hiatus to the anterior aspect of the esophagus to help obliterate the space as well.    The operative field was inspected for hemostasis.  The Penrose drain was removed.  The 12 mm trocar was closed using Vicryl on a PDS suture passer.  Local was instilled around the incisions.  The peritoneal cavity was desufflated and the trocars removed.  The incisions were closed with 4-0 Monocryl subcuticular sutures and skin glue.     Deward Foy, MD General, Bariatric, & Minimally Invasive Surgery [facility] Surgery, [place]

## 2023-09-25 NOTE — Addendum Note (Signed)
 Addendum  created 09/25/23 1420 by Waddell Lauraine NOVAK, MD   Attestation recorded in Intraprocedure, Intraprocedure Attestations filed

## 2023-09-25 NOTE — TOC Progression Note (Signed)
 Transition of Care [facility]) - Progression Note    Patient Details  Name: Shelly Hammond MRN: [id number] Date of Birth: Sep 23, 1950  Transition of Care [facility]) CM/SW Contact  Tom-Johnson, Harvest Muskrat, RN Phone Number: 09/25/2023, 3:38 PM  Clinical Narrative:     Patient underwent Laparoscopic Hiatal Hernia repair with mesh, Reduction of Gastric Volvulus And Upper Endoscopy today 09/25/23 by Gen sx.   Patient not Medically ready for discharge.  CM will continue to follow as patient progresses with care towards discharge.               Expected Discharge Plan and Services                                               Social Drivers of Health (SDOH) Interventions SDOH Screenings   Food Insecurity: No Food Insecurity (09/18/2023)  Housing: Low Risk  (09/18/2023)  Transportation Needs: No Transportation Needs (09/18/2023)  Utilities: Not At Risk (09/18/2023)  Social Connections: Patient Declined (09/18/2023)  Tobacco Use: Low Risk  (09/25/2023)    Readmission Risk Interventions    09/18/2023    4:16 PM  Readmission Risk Prevention Plan  Post Dischage Appt Complete  Medication Screening Complete  Transportation Screening Complete

## 2023-09-25 NOTE — Transfer of Care (Signed)
 Immediate Anesthesia Transfer of Care Note  Patient: Shelly Hammond  Procedure(s) Performed: REPAIR, HERNIA, HIATAL, LAPAROSCOPIC WITH MESH (Abdomen) EGD (ESOPHAGOGASTRODUODENOSCOPY) (Abdomen) INSERTION OF MESH (Abdomen)  Patient Location: PACU  Anesthesia Type:General  Level of Consciousness: drowsy  Airway & Oxygen Therapy: Patient Spontanous Breathing and Patient connected to face mask oxygen  Post-op Assessment: Report given to RN and Post -op Vital signs reviewed and stable  Post vital signs: Reviewed and stable  Last Vitals:  Vitals Value Taken Time  BP 149/86 09/25/23 12:32  Temp 97.9   Pulse 90 09/25/23 12:35  Resp 10 09/25/23 12:35  SpO2 100 % 09/25/23 12:35  Vitals shown include unfiled device data.  Last Pain:  Vitals:   09/25/23 0803  TempSrc: Oral  PainSc: 0-No pain         Complications: No notable events documented.

## 2023-09-25 NOTE — Progress Notes (Signed)
 PHARMACY - TOTAL PARENTERAL NUTRITION CONSULT NOTE  Indication:  GOO with hiatal hernia   Patient Measurements: Height: 5' 3 (160 cm) Weight: 67.1 kg (147 lb 14.9 oz) IBW/kg (Calculated) : 52.4 TPN AdjBW (KG): 56.5 Body mass index is 26.2 kg/m. Usual Weight: 68 kg. Patient states she has not lost any weight in the most recent 3 months time frame.  Assessment:  53 YOF admitted with a PMH significant for hiatal hernia with GOO requiring surgical intervention. She has had N/V since Saturday. The vomiting was accompanied by upper abdominal pain. Emesis was brown in color with no evidence of blood. She has began having nausea since early 2024 when eating which prompted a visit to PCP resulting in a referral to GI who discovered a twisted colon identified on prior colonoscopy. Most recent food intake was noted as rice pilaf with a spice packet. Consult for TPN has been received.  Noted shellfish allergy.  Patient has tolerated SMOFlipids.  Glucose / Insulin : hx preDM - CBGs well controlled Used 0 unit SSI in the past 24 hrs - CBGs d/c'd Electrolytes: all WNL Renal: SCr < 1, BUN WNL Hepatic: LFTs / tbili WNL, albumin  2.9 Intake / Output; MIVF: UOP not charted, NG , LBM 9/11 GI Imaging: 9/8 CT: large hiatal hernia upper 2/3 stomach intrathoracic, appears to be impaired gastric emptying GI Surgeries / Procedures:  none  Central access: PICC placed 09/21/23 TPN start date: 09/21/23  Nutritional Goals: Goal TPN rate is 70 mL/hr (= 87g AA and 1716 kcals per day)  RD Estimated Needs Total Energy Estimated Needs: 1700-1900 kcals Total Protein Estimated Needs: 80-95g Total Fluid Estimated Needs: 1.7-1.9L/day  Current Nutrition:  TPN  Plan:  Continue TPN at goal rate of 58mL/hr to provide 100% of needs Electrolytes in TPN: Na 80 mEq/L, K 60 mEq/L, Ca 5 mEq/L, Mg 8 mEq/L, Phos 9 mmol/L, change Cl:Ac to 1:1  Add standard MVI and trace elements to TPN D/C SSI/CBG checks Monitor TPN  labs on Mon/Thurs  F/u Surgery:  possible laparoscopic vs robotic repair of hernia - on 9/15  Donny Alert, PharmD, [facility] Clinical Pharmacist Please see AMION for all Pharmacists' Contact Phone Numbers 09/25/2023, 7:39 AM

## 2023-09-26 ENCOUNTER — Encounter (HOSPITAL_COMMUNITY): Payer: Self-pay | Admitting: Surgery

## 2023-09-26 LAB — BASIC METABOLIC PANEL WITH GFR
Anion gap: 10 (ref 5–15)
BUN: 14 mg/dL (ref 8–23)
CO2: 25 mmol/L (ref 22–32)
Calcium: 8.5 mg/dL — ABNORMAL LOW (ref 8.9–10.3)
Chloride: 101 mmol/L (ref 98–111)
Creatinine, Ser: 0.66 mg/dL (ref 0.44–1.00)
GFR, Estimated: >60 mL/min (ref >60)
Glucose, Bld: 135 mg/dL — ABNORMAL HIGH (ref 70–99)
Potassium: 4 mmol/L (ref 3.5–5.1)
Sodium: 136 mmol/L (ref 135–145)

## 2023-09-26 LAB — CBC
HCT: 38.1 % (ref 36.0–46.0)
Hemoglobin: 12.7 g/dL (ref 12.0–15.0)
MCH: 30.4 pg (ref 26.0–34.0)
MCHC: 33.3 g/dL (ref 30.0–36.0)
MCV: 91.1 fL (ref 80.0–100.0)
Platelets: 358 K/uL (ref 150–400)
RBC: 4.18 MIL/uL (ref 3.87–5.11)
RDW: 13.6 % (ref 11.5–15.5)
WBC: 9.3 K/uL (ref 4.0–10.5)
nRBC: 0 % (ref 0.0–0.2)

## 2023-09-26 MED ORDER — ENSURE MAX PROTEIN PO LIQD
11.0000 [oz_av] | Freq: Three times a day (TID) | ORAL | Status: DC
  Administered 2023-09-26 – 2023-09-28 (×7): 11 [oz_av] via ORAL
  Filled 2023-09-26 (×9): qty 330

## 2023-09-26 MED ORDER — TRAVASOL 10 % IV SOLN
INTRAVENOUS | Status: DC
  Filled 2023-09-26: qty 873.6

## 2023-09-26 MED ORDER — SODIUM CHLORIDE (PF) 0.9 % IJ SOLN
INTRAMUSCULAR | Status: AC
  Administered 2023-09-26: 10 mL
  Filled 2023-09-26: qty 10

## 2023-09-26 MED ORDER — TRAVASOL 10 % IV SOLN
INTRAVENOUS | Status: DC
  Filled 2023-09-26: qty 436.8

## 2023-09-26 NOTE — Progress Notes (Signed)
  1 Day Post-Op   Chief Complaint/Subjective: Abdominal soreness around incisions  Objective: Vital signs in last 24 hours: Temp:  [97.4 F (36.3 C)-98.7 F (37.1 C)] 98.7 F (37.1 C) (09/16 0341) Pulse Rate:  [80-98] 90 (09/16 0341) Resp:  [10-20] 17 (09/16 0341) BP: (144-185)/(68-96) 162/76 (09/16 0341) SpO2:  [93 %-100 %] 94 % (09/16 0341) Last BM Date : 09/26/23 Intake/Output from previous day: 09/15 0701 - 09/16 0700 In: 3815.2 [P.O.:160; I.V.:3305.2; IV Piggyback:350] Out: 265 [Urine:240; Blood:25]  PE: Gen: NAD Resp: nonlabored Card: RRR Abd: soft, NG tube in position with yellow liquid output  Lab Results:  No results for input(s): WBC, HGB, HCT, PLT in the last 72 hours.  Recent Labs    09/25/23 0243 09/26/23 0324  NA 135 136  K 4.3 4.0  CL 101 101  CO2 25 25  GLUCOSE 122* 135*  BUN 19 14  CREATININE 0.72 0.66  CALCIUM  8.7* 8.5*   No results for input(s): LABPROT, INR in the last 72 hours.    Component Value Date/Time   NA 136 09/26/2023 0324   K 4.0 09/26/2023 0324   CL 101 09/26/2023 0324   CO2 25 09/26/2023 0324   GLUCOSE 135 (H) 09/26/2023 0324   BUN 14 09/26/2023 0324   CREATININE 0.66 09/26/2023 0324   CREATININE 1.01 (H) 07/27/2021 0844   CREATININE 0.82 05/04/2017 0957   CALCIUM  8.5 (L) 09/26/2023 0324   PROT 7.3 09/25/2023 0243   ALBUMIN  2.9 (L) 09/25/2023 0243   AST 27 09/25/2023 0243   AST 14 (L) 07/27/2021 0844   ALT 25 09/25/2023 0243   ALT 11 07/27/2021 0844   ALKPHOS 51 09/25/2023 0243   BILITOT 0.5 09/25/2023 0243   BILITOT 0.4 07/27/2021 0844   GFRNONAA >60 09/26/2023 0324   GFRNONAA >60 07/27/2021 0844   GFRNONAA 75 05/04/2017 0957   GFRAA 87 05/04/2017 0957    Assessment/Plan Hiatal Hernia with gastric outlet obstruction - CT 9/8 with large HH w/ upper 2/3 of stomach intrathoracic - S/p NGT decompression. Total NG output 650 yesterday. - UGI 9/11 without any visualized contrast passing through  obstruction, NGT seen ending in herniated stomach - Laparoscopic hiatal hernia repair with bio-A mesh on 09/25/23  - FLD with ensure shakes - will slowly advance diet consistency to regular food at home - half TPN today, stop tomorrow if tolerating diet - Pain control - Increase activity   FEN - FLD, 1/2 TPN VTE - lovenox  40 mg daily ID - periop abx Disposition - inpatient, surgery today   LOS: 8 days   Post op  Deward PARAS [facility] Surgery at [facility] 09/26/2023, 8:27 AM Please see Amion for pager number during day hours 7:00am-4:30pm or 7:00am -11:30am on weekends

## 2023-09-26 NOTE — Progress Notes (Addendum)
 PHARMACY - TOTAL PARENTERAL NUTRITION CONSULT NOTE  Indication:  GOO with hiatal hernia   Patient Measurements: Height: 5' 3 (160 cm) Weight: 67.1 kg (147 lb 14.9 oz) IBW/kg (Calculated) : 52.4 TPN AdjBW (KG): 56.5 Body mass index is 26.2 kg/m. Usual Weight: 68 kg. Patient states she has not lost any weight in the most recent 3 months time frame.  Assessment:  44 YOF admitted with a PMH significant for hiatal hernia with GOO requiring surgical intervention. She has had N/V since Saturday. The vomiting was accompanied by upper abdominal pain. Emesis was brown in color with no evidence of blood. She has began having nausea since early 2024 when eating which prompted a visit to PCP resulting in a referral to GI who discovered a twisted colon identified on prior colonoscopy. Most recent food intake was noted as rice pilaf with a spice packet. Consult for TPN has been received.  Noted shellfish allergy.  Patient has tolerated SMOFlipids.  Glucose / Insulin : hx preDM - CBGs well controlled Used 0 unit SSI in the past 24 hrs - CBGs d/c'd Electrolytes: all WNL Renal: SCr < 1, BUN WNL Hepatic: LFTs / tbili WNL, albumin  2.9 Intake / Output; MIVF: UOP not charted, NG 0mL, LBM 9/15 GI Imaging: 9/8 CT: large hiatal hernia upper 2/3 stomach intrathoracic, appears to be impaired gastric emptying GI Surgeries / Procedures:   9/15 Hiatal hernia repair with mesh and gastric volvulus reduction  Central access: PICC placed 09/21/23 TPN start date: 09/21/23  Nutritional Goals: Goal TPN rate is 70 mL/hr (= 87g AA and 1716 kcals per day)  RD Estimated Needs Total Energy Estimated Needs: 1700-1900 kcals Total Protein Estimated Needs: 80-95g Total Fluid Estimated Needs: 1.7-1.9L/day  Current Nutrition:  TPN Liquid diet  Plan:  1/2 TPN at 35 mL/hr to provide 50% of needs Electrolytes in TPN: Na 80 mEq/L, K 60 mEq/L, Ca 5 mEq/L, Mg 8 mEq/L, Phos 9 mmol/L, change Cl:Ac to 1:1  Add standard MVI and  trace elements to TPN D/C'd SSI/CBG checks Monitor TPN labs on Mon/Thurs and in am F/u Surgery: starting liquid diet 9/16  Donny Alert, PharmD, [facility] Clinical Pharmacist Please see AMION for all Pharmacists' Contact Phone Numbers 09/26/2023, 7:06 AM

## 2023-09-26 NOTE — Plan of Care (Signed)
 Shelly Hammond is seemingly well appearing. She was able to take all of her prescribed medications today. Get from bed to chair and from bed to [facility]. She was able to increase her intake with added ENSURE. Repeated education was given about increasing independent activity level to return to ambulatory baseline. Patient states understands but is hesitant to do activities independently.

## 2023-09-27 LAB — MAGNESIUM: Magnesium: 2 mg/dL (ref 1.7–2.4)

## 2023-09-27 LAB — BASIC METABOLIC PANEL WITH GFR
Anion gap: 9 (ref 5–15)
BUN: 14 mg/dL (ref 8–23)
CO2: 25 mmol/L (ref 22–32)
Calcium: 8.4 mg/dL — ABNORMAL LOW (ref 8.9–10.3)
Chloride: 99 mmol/L (ref 98–111)
Creatinine, Ser: 0.67 mg/dL (ref 0.44–1.00)
GFR, Estimated: >60 mL/min (ref >60)
Glucose, Bld: 113 mg/dL — ABNORMAL HIGH (ref 70–99)
Potassium: 3.9 mmol/L (ref 3.5–5.1)
Sodium: 133 mmol/L — ABNORMAL LOW (ref 135–145)

## 2023-09-27 LAB — PHOSPHORUS: Phosphorus: 3.8 mg/dL (ref 2.5–4.6)

## 2023-09-27 MED ORDER — PANTOPRAZOLE SODIUM 40 MG PO TBEC
40.0000 mg | DELAYED_RELEASE_TABLET | Freq: Two times a day (BID) | ORAL | Status: DC
  Administered 2023-09-27 – 2023-09-28 (×3): 40 mg via ORAL
  Filled 2023-09-27: qty 2
  Filled 2023-09-27 (×2): qty 1

## 2023-09-27 NOTE — TOC Progression Note (Signed)
 Transition of Care [facility]) - Progression Note    Patient Details  Name: Shelly Hammond MRN: [id number] Date of Birth: Mar 18, 1950  Transition of Care [facility]) CM/SW Contact  Tom-Johnson, Chaquetta Schlottman Daphne, RN Phone Number: 09/27/2023, 1:08 PM  Clinical Narrative:     Patient's NG tube discontinued, TPN stopped, on Full Liquid Diet, advance as tolerated. Gen Sx following.   Patient not Medically ready for discharge.  CM will continue to follow as patient progresses with care towards discharge.                        Expected Discharge Plan and Services                                               Social Drivers of Health (SDOH) Interventions SDOH Screenings   Food Insecurity: No Food Insecurity (09/18/2023)  Housing: Low Risk  (09/18/2023)  Transportation Needs: No Transportation Needs (09/18/2023)  Utilities: Not At Risk (09/18/2023)  Social Connections: Patient Declined (09/18/2023)  Tobacco Use: Low Risk  (09/25/2023)    Readmission Risk Interventions    09/18/2023    4:16 PM  Readmission Risk Prevention Plan  Post Dischage Appt Complete  Medication Screening Complete  Transportation Screening Complete

## 2023-09-27 NOTE — Progress Notes (Signed)
  2 Days Post-Op   Chief Complaint/Subjective: Feeling slowly better.  Tolerating liquids and protein shakes  Objective: Vital signs in last 24 hours: Temp:  [98.1 F (36.7 C)-98.8 F (37.1 C)] 98.1 F (36.7 C) (09/17 0533) Pulse Rate:  [90-115] 90 (09/17 0533) Resp:  [18] 18 (09/17 0533) BP: (134-160)/(66-89) 134/66 (09/17 0533) SpO2:  [93 %-96 %] 94 % (09/17 0533) Weight:  [67.1 kg] 67.1 kg (09/16 0922) Last BM Date : 09/26/23 Intake/Output from previous day: 09/16 0701 - 09/17 0700 In: 626.7 [P.O.:240; I.V.:386.7] Out: 0   PE: Gen: NAD Resp: nonlabored Card: RRR Abd: soft, incisions c/d/I w/ glue  Lab Results:  Recent Labs    09/26/23 1017  WBC 9.3  HGB 12.7  HCT 38.1  PLT 358    Recent Labs    09/26/23 0324 09/27/23 0259  NA 136 133*  K 4.0 3.9  CL 101 99  CO2 25 25  GLUCOSE 135* 113*  BUN 14 14  CREATININE 0.66 0.67  CALCIUM  8.5* 8.4*   No results for input(s): LABPROT, INR in the last 72 hours.    Component Value Date/Time   NA 133 (L) 09/27/2023 0259   K 3.9 09/27/2023 0259   CL 99 09/27/2023 0259   CO2 25 09/27/2023 0259   GLUCOSE 113 (H) 09/27/2023 0259   BUN 14 09/27/2023 0259   CREATININE 0.67 09/27/2023 0259   CREATININE 1.01 (H) 07/27/2021 0844   CREATININE 0.82 05/04/2017 0957   CALCIUM  8.4 (L) 09/27/2023 0259   PROT 7.3 09/25/2023 0243   ALBUMIN  2.9 (L) 09/25/2023 0243   AST 27 09/25/2023 0243   AST 14 (L) 07/27/2021 0844   ALT 25 09/25/2023 0243   ALT 11 07/27/2021 0844   ALKPHOS 51 09/25/2023 0243   BILITOT 0.5 09/25/2023 0243   BILITOT 0.4 07/27/2021 0844   GFRNONAA >60 09/27/2023 0259   GFRNONAA >60 07/27/2021 0844   GFRNONAA 75 05/04/2017 0957   GFRAA 87 05/04/2017 0957    Assessment/Plan Hiatal Hernia with gastric outlet obstruction - CT 9/8 with large HH w/ upper 2/3 of stomach intrathoracic - S/p NGT decompression - UGI 9/11 without any visualized contrast passing through obstruction, NGT seen ending in  herniated stomach - Laparoscopic hiatal hernia repair with bio-A mesh on 09/25/23  - FLD with ensure shakes - will slowly advance diet consistency to regular food at home - Stop TPN - Pain control - Increase activity   FEN - FLD, stop TPN VTE - lovenox  40 mg daily ID - periop abx Disposition - inpatient, home tomorrow if tolerating diet   LOS: 9 days    Deward PARAS [facility] 09/27/2023, 7:29 AM Please see Amion for pager number during day hours 7:00am-4:30pm or 7:00am -11:30am on weekends

## 2023-09-27 NOTE — Progress Notes (Signed)
 PHARMACY - TOTAL PARENTERAL NUTRITION CONSULT NOTE  Indication:  GOO with hiatal hernia   Patient Measurements: Height: 5' 3 (160 cm) Weight: 67.1 kg (147 lb 14.9 oz) IBW/kg (Calculated) : 52.4 TPN AdjBW (KG): 56.1 Body mass index is 26.2 kg/m. Usual Weight: 68 kg. Patient states she has not lost any weight in the most recent 3 months time frame.  Assessment:  78 YOF admitted with a PMH significant for hiatal hernia with GOO requiring surgical intervention. She has had N/V since Saturday. The vomiting was accompanied by upper abdominal pain. Emesis was brown in color with no evidence of blood. She has began having nausea since early 2024 when eating which prompted a visit to PCP resulting in a referral to GI who discovered a twisted colon identified on prior colonoscopy. Most recent food intake was noted as rice pilaf with a spice packet. Consult for TPN has been received.  Noted shellfish allergy.  Patient has tolerated SMOFlipids.  Glucose / Insulin : hx preDM - CBGs well controlled Used 0 unit SSI in the past 24 hrs - CBGs d/c'd Electrolytes: all WNL exc Na 133 Renal: SCr < 1, BUN WNL Hepatic: LFTs / tbili WNL, albumin  2.9 Intake / Output; MIVF: UOP not charted, NG 0mL, LBM 9/16 GI Imaging: 9/8 CT: large hiatal hernia upper 2/3 stomach intrathoracic, appears to be impaired gastric emptying GI Surgeries / Procedures:   9/15 Hiatal hernia repair with mesh and gastric volvulus reduction  Central access: PICC placed 09/21/23 TPN start date: 09/21/23  Nutritional Goals: Goal TPN rate is 70 mL/hr (= 87g AA and 1716 kcals per day)  RD Estimated Needs Total Energy Estimated Needs: 1700-1900 kcals Total Protein Estimated Needs: 80-95g Total Fluid Estimated Needs: 1.7-1.9L/day  Current Nutrition:  TPN Full liquid diet  Plan:  Stop TPN per Surgery  Thank you for involving pharmacy in this patient's care.  Delon Sax, PharmD, BCPS Clinical Pharmacist Clinical phone for  09/27/2023 is [phone] 09/27/2023 7:00 AM

## 2023-09-28 MED ORDER — TRAMADOL HCL 50 MG PO TABS: 50.0000 mg | ORAL_TABLET | Freq: Four times a day (QID) | ORAL | 0 refills | Status: AC | PRN

## 2023-09-28 MED ORDER — ACETAMINOPHEN 325 MG PO TABS: 500.0000 mg | ORAL_TABLET | Freq: Four times a day (QID) | ORAL | Status: AC | PRN

## 2023-09-28 NOTE — Plan of Care (Signed)
 Patient is discharge to home. Picc line removed. No acute distress noted. Patient is awaiting transport.

## 2023-09-28 NOTE — Progress Notes (Signed)
 Progress Note  3 Days Post-Op  Subjective: Patient feeling well. Tolerating FLD without nausea or vomiting. Had small BM yesterday. None today. Passing flatus. Feels pain is minimal and manageable.  ROS  All negative with the exception of above.  Objective: Vital signs in last 24 hours: Temp:  [98.1 F (36.7 C)-98.4 F (36.9 C)] 98.4 F (36.9 C) (09/18 0739) Pulse Rate:  [91-105] 97 (09/18 0739) Resp:  [18-20] 18 (09/18 0739) BP: (117-128)/(62-88) 128/88 (09/18 0739) SpO2:  [92 %] 92 % (09/18 0427) Weight:  [65.6 kg] 65.6 kg (09/18 0600) Last BM Date : 09/26/23  Intake/Output from previous day: No intake/output data recorded. Intake/Output this shift: No intake/output data recorded.  PE: General: Pleasant female who is laying in bed in NAD. HEENT: head is normocephalic, atraumatic.  Sclera are noninjected. Heart: HR normal Lungs: Respiratory effort nonlabored Abd: Soft, NT, ND. Incisions C/D/I. No rebound tenderness or guarding. Skin: Warm and dry. Psych: A&Ox3 with an appropriate affect.    Lab Results:  Recent Labs    09/26/23 1017  WBC 9.3  HGB 12.7  HCT 38.1  PLT 358   BMET Recent Labs    09/26/23 0324 09/27/23 0259  NA 136 133*  K 4.0 3.9  CL 101 99  CO2 25 25  GLUCOSE 135* 113*  BUN 14 14  CREATININE 0.66 0.67  CALCIUM  8.5* 8.4*   PT/INR No results for input(s): LABPROT, INR in the last 72 hours. CMP     Component Value Date/Time   NA 133 (L) 09/27/2023 0259   K 3.9 09/27/2023 0259   CL 99 09/27/2023 0259   CO2 25 09/27/2023 0259   GLUCOSE 113 (H) 09/27/2023 0259   BUN 14 09/27/2023 0259   CREATININE 0.67 09/27/2023 0259   CREATININE 1.01 (H) 07/27/2021 0844   CREATININE 0.82 05/04/2017 0957   CALCIUM  8.4 (L) 09/27/2023 0259   PROT 7.3 09/25/2023 0243   ALBUMIN  2.9 (L) 09/25/2023 0243   AST 27 09/25/2023 0243   AST 14 (L) 07/27/2021 0844   ALT 25 09/25/2023 0243   ALT 11 07/27/2021 0844   ALKPHOS 51 09/25/2023 0243    BILITOT 0.5 09/25/2023 0243   BILITOT 0.4 07/27/2021 0844   GFRNONAA >60 09/27/2023 0259   GFRNONAA >60 07/27/2021 0844   GFRNONAA 75 05/04/2017 0957   GFRAA 87 05/04/2017 0957   Lipase     Component Value Date/Time   LIPASE 35 09/18/2023 0020       Studies/Results: No results found.  Anti-infectives: Anti-infectives (From admission, onward)    Start     Dose/Rate Route Frequency Ordered Stop   09/24/23 1115  ceFAZolin  (ANCEF ) IVPB 2g/100 mL premix  Status:  Discontinued        2 g 200 mL/hr over 30 Minutes Intravenous On call to O.R. 09/24/23 1018 09/25/23 0559        Assessment/Plan Hiatal Hernia with gastric outlet obstruction - CT 9/8 with large HH w/ upper 2/3 of stomach intrathoracic - S/p NGT decompression. - UGI 9/11 without any visualized contrast passing through obstruction, NGT seen ending in herniated stomach - Laparoscopic hiatal hernia repair with bio-A mesh on 09/25/23. POD3. - Tolerating FLD without n/v. Having bowel function. Minimal pain. Will advance to soft diet. - TPN discontinued. - Continue to mobilize as tolerated.     FEN - Soft;  VTE - lovenox  40 mg daily ID - periop abx Disposition - inpatient, home today if tolerating diet.    LOS: 10  days   I reviewed specialist notes, nursing notes, last 24 h vitals and pain scores, last 48 h intake and output, last 24 h labs and trends, and last 24 h imaging results.   Marjorie Carlyon Favre, [facility] 09/28/2023, 9:45 AM Please see Amion for pager number during day hours 7:00am-4:30pm

## 2023-09-28 NOTE — TOC Transition Note (Signed)
 Transition of Care [facility]) - Discharge Note   Patient Details  Name: Shelly Hammond MRN: [id number] Date of Birth: 04-09-1950  [facility]) CM/SW Contact:  Tom-Johnson, Lemon Whitacre Daphne, RN Phone Number: 09/28/2023, 3:36 PM   Clinical Narrative:     Patient is scheduled for discharge today.  Readmission Risk Assessment done. Outpatient f/u, hospital f/u and discharge instructions on AVS. No ICM needs or recommendations noted. Cab voucher will be given to patient at the discharge lounge to transport at discharge.  No further ICM needs noted.      Final next level of care: Home/Self Care Barriers to Discharge: Barriers Resolved   Patient Goals and CMS Choice Patient states their goals for this hospitalization and ongoing recovery are:: To return home CMS Medicare.gov Compare Post Acute Care list provided to:: Patient Choice offered to / list presented to : NA      Discharge Placement                Patient to be transferred to facility by: [facility]      Discharge Plan and Services Additional resources added to the After Visit Summary for                  DME Arranged: N/A DME Agency: NA       HH Arranged: NA HH Agency: NA        Social Drivers of Health (SDOH) Interventions SDOH Screenings   Food Insecurity: No Food Insecurity (09/18/2023)  Housing: Low Risk  (09/18/2023)  Transportation Needs: No Transportation Needs (09/18/2023)  Utilities: Not At Risk (09/18/2023)  Social Connections: Patient Declined (09/18/2023)  Tobacco Use: Low Risk  (09/25/2023)     Readmission Risk Interventions    09/28/2023    3:34 PM 09/18/2023    4:16 PM  Readmission Risk Prevention Plan  Post Dischage Appt  Complete  Medication Screening  Complete  Transportation Screening Complete Complete  PCP or Specialist Appt within 5-7 Days Complete   Home Care Screening Complete   Medication Review (RN CM) Referral to Pharmacy

## 2023-09-28 NOTE — Progress Notes (Signed)
 Discharge instructions (including medications) discussed with and copy provided to patient, she verbalized understanding. She is awaiting a PICC line removal and the order has been placed.

## 2023-10-03 NOTE — Discharge Summary (Signed)
 [facility] Discharge Summary   Patient ID: Shelly Hammond MRN: [id number] DOB/AGE: 71-Apr-1952 71 y.o.  Admit date: 09/17/2023 Discharge date: 09/28/2023  Admitting Diagnosis: Intractable nausea and vomiting Gastric outlet obstruction secondary to large hiatal hernia Atrial fibrillation Hypokalemia Hypocalcemia Thyroid mass  Discharge Diagnosis Patient Active Problem List   Diagnosis Date Noted   Intractable nausea and vomiting 09/18/2023   Gastric outlet obstruction 09/18/2023   Large hiatal hernia 09/18/2023   New onset atrial fibrillation (HCC) 09/18/2023   Hypokalemia 09/18/2023   Hypocalcemia 09/18/2023   Iron deficiency anemia due to chronic blood loss 05/23/2021   Seasonal allergic rhinitis due to pollen 05/04/2017   Osteopenia determined by x-ray 03/30/2017   Corn of foot 02/28/2017   History of iron deficiency anemia 02/28/2017   Family history of colon cancer in father 02/28/2017   Screening for colon cancer 02/28/2017   Coronary artery disease involving native heart without angina pectoris 02/28/2017   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 02/28/2017   Overweight (BMI 25.0-29.9) 02/28/2017   Prediabetes    Cataracts, bilateral 07/22/2015   PVD (posterior vitreous detachment), both eyes 07/22/2015   Thyroid mass 06/30/2015   Vitamin D  deficiency 06/19/2015   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 Lyndel (09/25/23) - Laparoscopic hiatal hernia repair with mesh; Reduction of gastric volvulus; Upper endoscopy  Hospital Course:  Shelly Hammond is a 73 year old  female who presented to ED with left should and arm pain associated with nausea and vomiting. Workup showed concern of large hiatal hernia with two thirds of the stomach within the chest causing gastric obstruction. Patient was admitted and underwent procedure listed above. Tolerated procedure well and was transferred to the floor.  Diet was advanced as tolerated. On POD3, the patient was voiding well, tolerating diet, ambulating well, pain well controlled, vital signs stable, incisions c/d/i and felt stable for discharge home.  Patient will follow up in our office and knows to call with questions or concerns.  She will call to confirm appointment date/time.    Physical Exam: General: Pleasant female who is laying in bed in NAD. HEENT: head is normocephalic, atraumatic.  Sclera are noninjected. Heart: HR normal Lungs: Respiratory effort nonlabored Abd: Soft, NT, ND. Incisions C/D/I. No rebound tenderness or guarding. Skin: Warm and dry. Psych: A&Ox3 with an appropriate affect.   I or a member of my team have reviewed this patient in the Controlled Substance Database.   Allergies as of 09/28/2023       Reactions   Peanut Oil Other (See Comments)   Other reaction(s): Other (See Comments) Avoid Peanuts, unknown reaction.   Penicillins Rash, Swelling   Latex Other (See Comments)   States, she can't remember   Methyldopa Other (See Comments)   vertigo   Dust Mite Extract    Other reaction(s): Unknown sneezing   Shellfish Allergy Other (See Comments)   Patient cannot recall symptoms        Medication List     TAKE these medications    acetaminophen  325 MG tablet  Commonly known as: TYLENOL  Take 1.5 tablets (487.5 mg total) by mouth every 6 (six) hours as needed for mild pain (pain score 1-3), fever or moderate pain (pain score 4-6) (or Fever >/= 101).   Multivitamin Womens 50+ Adv Tabs Take 1 tablet by mouth every morning.   traMADol  50 MG tablet Commonly known as: ULTRAM  Take  1 tablet (50 mg total) by mouth every 6 (six) hours as needed for moderate pain (pain score 4-6) or severe pain (pain score 7-10).          Follow-up Information     Stechschulte, Deward PARAS, MD. Go on 10/20/2023.   Specialty: Surgery Why: 10 AM, please arrive 30 min prior to appointment time for check in. Contact information: [address] [place] [address] [phone]                 Signed: Marjorie Carlyon Edmundo DEVONNA [facility] 10/03/2023, 2:54 PM Please see Amion for pager number during day hours 7:00am-4:30pm

## 2023-12-14 ENCOUNTER — Encounter (HOSPITAL_COMMUNITY): Payer: Self-pay | Admitting: Surgery
